# Patient Record
Sex: Female | Born: 2000 | Race: White | Hispanic: No | Marital: Single | Smoking: Never smoker
Health system: Southern US, Community
[De-identification: ages and names within clinical notes are randomized; demographics above are authoritative.]

---
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
## 2020-10-22 ENCOUNTER — Encounter (HOSPITAL_COMMUNITY): Payer: Self-pay

## 2020-10-22 ENCOUNTER — Other Ambulatory Visit: Payer: Self-pay

## 2020-10-22 ENCOUNTER — Ambulatory Visit (HOSPITAL_COMMUNITY)
Admission: EM | Admit: 2020-10-22 | Discharge: 2020-10-22 | Disposition: A | Discharge status: Home / Self Care | Payer: BLUE CROSS/BLUE SHIELD | Attending: Student | Admitting: Student

## 2020-10-22 DIAGNOSIS — H6121 Impacted cerumen, right ear: Secondary | ICD-10-CM | POA: Diagnosis not present

## 2020-10-22 NOTE — ED Triage Notes (Signed)
Pt in with c/o feeling like her right ear is clogged  Pt states she tried to gently clean it with a q tip today with no relief

## 2020-10-22 NOTE — ED Provider Notes (Signed)
[facility]    CSN: [id number] Arrival date & time: 10/22/20  1149      History   Chief Complaint Chief Complaint  Patient presents with  . ear clogged    HPI Wanda Delgado is a 20 y.o. female presenting with clogged right ear x8 hours.  States she has had issues with impacted cerumen in the past.  Tried to clean her ear out with a Q-tip this morning and that only made it worse.  Endorses muffled hearing R ear.  Denies pain, dizziness, tinnitus.  Denies URI symptoms including fever/chills, cough, congestion.  HPI  History reviewed. No pertinent past medical history.  There are no problems to display for this patient.   History reviewed. No pertinent surgical history.  OB History   No obstetric history on file.      Home Medications    Prior to Admission medications   Not on File    Family History History reviewed. No pertinent family history.  Social History Social History   Tobacco Use  . Smoking status: Never Smoker  . Smokeless tobacco: Never Used  Substance Use Topics  . Drug use: Never     Allergies   Patient has no known allergies.   Review of Systems Review of Systems  All other systems reviewed and are negative.    Physical Exam Triage Vital Signs ED Triage Vitals  Enc Vitals Group     BP      Pulse      Resp      Temp      Temp src      SpO2      Weight      Height      Head Circumference      Peak Flow      Pain Score      Pain Loc      Pain Edu?      Excl. in GC?    No data found.  Updated Vital Signs BP 127/78   Pulse 75   Temp 99 F (37.2 C) (Oral)   Resp 17   LMP 10/05/2020 (Exact Date)   SpO2 100%   Visual Acuity Right Eye Distance:   Left Eye Distance:   Bilateral Distance:    Right Eye Near:   Left Eye Near:    Bilateral Near:     Physical Exam Vitals reviewed.  Constitutional:      General: She is not in acute distress.    Appearance: Normal appearance. She is not ill-appearing  or diaphoretic.  HENT:     Head: Normocephalic and atraumatic.     Right Ear: Hearing, tympanic membrane, ear canal and external ear normal. No middle ear effusion. There is no impacted cerumen. No foreign body. Tympanic membrane is not perforated, erythematous, retracted or bulging.     Left Ear: Hearing, tympanic membrane, ear canal and external ear normal.  No middle ear effusion. There is no impacted cerumen. No foreign body. Tympanic membrane is not perforated, erythematous, retracted or bulging.     Ears:     Comments: R Tympanic membrane initially fully occluded by cerumen.  Following lavage, canal is erythematous but without bleeding or discharge.  Tympanic membrane is intact and appears healthy. Cardiovascular:     Rate and Rhythm: Normal rate and regular rhythm.     Heart sounds: Normal heart sounds.  Pulmonary:     Effort: Pulmonary effort is normal.     Breath sounds: Normal  breath sounds.  Skin:    General: Skin is warm.  Neurological:     General: No focal deficit present.     Mental Status: She is alert and oriented to person, place, and time.  Psychiatric:        Mood and Affect: Mood normal.        Behavior: Behavior normal.        Thought Content: Thought content normal.        Judgment: Judgment normal.      UC Treatments / Results  Labs (all labs ordered are listed, but only abnormal results are displayed) Labs Reviewed - No data to display  EKG   Radiology No results found.  Procedures Procedures (including critical care time)  Medications Ordered in UC Medications - No data to display  Initial Impression / Assessment and Plan / UC Course  I have reviewed the triage vital signs and the nursing notes.  Pertinent labs & imaging results that were available during my care of the patient were reviewed by me and considered in my medical decision making (see chart for details).     This patient is a 20 year old female presenting with right ear impacted  cerumen.  Following lavage, bilateral tympanic membranes are able to be visualized and appear healthy and intact.  Recommended Debrox drops for maintenance at home. Final Clinical Impressions(s) / UC Diagnoses   Final diagnoses:  Impacted cerumen of right ear   Discharge Instructions   None    ED Prescriptions    None     PDMP not reviewed this encounter.   Rhys Martini, PA-C 10/22/20 1336

## 2021-05-06 ENCOUNTER — Other Ambulatory Visit: Payer: Self-pay

## 2021-05-06 ENCOUNTER — Encounter: Payer: Self-pay | Admitting: Emergency Medicine

## 2021-05-06 ENCOUNTER — Ambulatory Visit
Admission: EM | Admit: 2021-05-06 | Discharge: 2021-05-06 | Disposition: A | Discharge status: Home / Self Care | Payer: Federal, State, Local not specified - PPO | Attending: Internal Medicine | Admitting: Internal Medicine

## 2021-05-06 DIAGNOSIS — R197 Diarrhea, unspecified: Secondary | ICD-10-CM | POA: Diagnosis not present

## 2021-05-06 DIAGNOSIS — R6889 Other general symptoms and signs: Secondary | ICD-10-CM | POA: Diagnosis not present

## 2021-05-06 DIAGNOSIS — Z20822 Contact with and (suspected) exposure to covid-19: Secondary | ICD-10-CM

## 2021-05-06 DIAGNOSIS — R112 Nausea with vomiting, unspecified: Secondary | ICD-10-CM | POA: Diagnosis not present

## 2021-05-06 DIAGNOSIS — A084 Viral intestinal infection, unspecified: Secondary | ICD-10-CM | POA: Diagnosis not present

## 2021-05-06 LAB — POCT INFLUENZA A/B
Influenza A, POC: NEGATIVE
Influenza B, POC: NEGATIVE

## 2021-05-06 MED ORDER — ONDANSETRON 4 MG PO TBDP: 4.0000 mg | ORAL_TABLET | Freq: Three times a day (TID) | ORAL | 0 refills | Status: DC | PRN

## 2021-05-06 NOTE — Discharge Instructions (Signed)
It appears that you have a viral illness that should resolve in the next few days. You have been prescribed a nausea medication to take as needed. Increase clear oral fluid intake as well. Go to the hospital if symptoms worsen.

## 2021-05-06 NOTE — ED Triage Notes (Signed)
Began having severe generalized abdominal cramps last night that caused her to have multiple episodes of nausea, vomiting, and diarrhea, to the point where she felt like she was going to pass out. Reports body aching and runny nose as well.

## 2021-05-06 NOTE — ED Provider Notes (Signed)
[facility]    CSN: [id number] Arrival date & time: 05/06/21  1335      History   Chief Complaint Chief Complaint  Patient presents with   Abdominal Pain    HPI Wanda Delgado is a 20 y.o. female.   Patient presents with nausea, vomiting, diarrhea, abdominal pain, body aches, fatigue that started last night.  Patient reports that her abdominal pain is generalized and is cramping in nature.  Denies any fevers or sick contacts.  Patient also has runny nose as well that started last night.  She has not yet taken any medications to help alleviate symptoms.  She has been able to keep fluids down.  Denies urinary burning, urinary frequency, irregular vaginal bleeding, any chance of pregnancy.   Abdominal Pain  History reviewed. No pertinent past medical history.  There are no problems to display for this patient.   History reviewed. No pertinent surgical history.  OB History   No obstetric history on file.      Home Medications    Prior to Admission medications   Medication Sig Start Date End Date Taking? Authorizing Provider  ondansetron (ZOFRAN-ODT) 4 MG disintegrating tablet Take 1 tablet (4 mg total) by mouth every 8 (eight) hours as needed for nausea or vomiting. 05/06/21  Yes Gustavus Bryant, FNP    Family History History reviewed. No pertinent family history.  Social History Social History   Tobacco Use   Smoking status: Never   Smokeless tobacco: Never  Substance Use Topics   Drug use: Never     Allergies   Patient has no known allergies.   Review of Systems Review of Systems Per HPI  Physical Exam Triage Vital Signs ED Triage Vitals  Enc Vitals Group     BP 05/06/21 1439 127/78     Pulse Rate 05/06/21 1439 61     Resp 05/06/21 1439 16     Temp 05/06/21 1439 98.2 F (36.8 C)     Temp Source 05/06/21 1439 Oral     SpO2 05/06/21 1439 98 %     Weight --      Height --      Head Circumference --      Peak Flow --      Pain  Score 05/06/21 1440 6     Pain Loc --      Pain Edu? --      Excl. in GC? --    No data found.  Updated Vital Signs BP 127/78 (BP Location: Right Arm)   Pulse 61   Temp 98.2 F (36.8 C) (Oral)   Resp 16   SpO2 98%   Visual Acuity Right Eye Distance:   Left Eye Distance:   Bilateral Distance:    Right Eye Near:   Left Eye Near:    Bilateral Near:     Physical Exam Constitutional:      General: She is not in acute distress.    Appearance: Normal appearance. She is not toxic-appearing or diaphoretic.  HENT:     Head: Normocephalic and atraumatic.     Right Ear: Tympanic membrane and ear canal normal.     Left Ear: Tympanic membrane and ear canal normal.     Nose: Congestion present.     Mouth/Throat:     Mouth: Mucous membranes are moist.     Pharynx: No posterior oropharyngeal erythema.  Eyes:     Extraocular Movements: Extraocular movements intact.     Conjunctiva/sclera: Conjunctivae  normal.     Pupils: Pupils are equal, round, and reactive to light.  Cardiovascular:     Rate and Rhythm: Normal rate and regular rhythm.     Pulses: Normal pulses.     Heart sounds: Normal heart sounds.  Pulmonary:     Effort: Pulmonary effort is normal. No respiratory distress.     Breath sounds: Normal breath sounds. No stridor. No wheezing, rhonchi or rales.  Abdominal:     General: Abdomen is flat. Bowel sounds are normal.     Palpations: Abdomen is soft.     Tenderness: There is no abdominal tenderness. There is no right CVA tenderness, left CVA tenderness, guarding or rebound. Negative signs include Murphy's sign, Rovsing's sign, McBurney's sign, psoas sign and obturator sign.  Musculoskeletal:        General: Normal range of motion.     Cervical back: Normal range of motion.  Skin:    General: Skin is warm and dry.  Neurological:     General: No focal deficit present.     Mental Status: She is alert and oriented to person, place, and time. Mental status is at baseline.   Psychiatric:        Mood and Affect: Mood normal.        Behavior: Behavior normal.     UC Treatments / Results  Labs (all labs ordered are listed, but only abnormal results are displayed) Labs Reviewed  NOVEL CORONAVIRUS, NAA  POCT INFLUENZA A/B    EKG   Radiology No results found.  Procedures Procedures (including critical care time)  Medications Ordered in UC Medications - No data to display  Initial Impression / Assessment and Plan / UC Course  I have reviewed the triage vital signs and the nursing notes.  Pertinent labs & imaging results that were available during my care of the patient were reviewed by me and considered in my medical decision making (see chart for details).     Patient's symptoms appear viral in etiology.  No suspicion for urinary tract infection or any gynecological problem.  Rapid flu was negative.  COVID-19 PCR pending.  Ondansetron prescribed as needed for nausea.  Discussed strict return precautions.  Patient to increase clear oral fluid intake.  Advised patient to go to the hospital if symptoms worsen or do not improve.  Patient verbalized understanding and was agreeable with plan. Final Clinical Impressions(s) / UC Diagnoses   Final diagnoses:  Viral gastroenteritis  Nausea vomiting and diarrhea  Flu-like symptoms  Encounter for laboratory testing for COVID-19 virus     Discharge Instructions      It appears that you have a viral illness that should resolve in the next few days. You have been prescribed a nausea medication to take as needed. Increase clear oral fluid intake as well. Go to the hospital if symptoms worsen.     ED Prescriptions     Medication Sig Dispense Auth. Provider   ondansetron (ZOFRAN-ODT) 4 MG disintegrating tablet Take 1 tablet (4 mg total) by mouth every 8 (eight) hours as needed for nausea or vomiting. 20 tablet [place], Acie Fredrickson, [place]      PDMP not reviewed this encounter.   Gustavus Bryant, [place] 05/06/21  [phone]

## 2021-05-07 LAB — NOVEL CORONAVIRUS, NAA: SARS-CoV-2, NAA: NOT DETECTED

## 2021-05-13 ENCOUNTER — Ambulatory Visit
Admission: EM | Admit: 2021-05-13 | Discharge: 2021-05-13 | Disposition: A | Discharge status: Home / Self Care | Payer: Federal, State, Local not specified - PPO

## 2021-05-13 ENCOUNTER — Other Ambulatory Visit: Payer: Self-pay

## 2021-05-13 DIAGNOSIS — J069 Acute upper respiratory infection, unspecified: Secondary | ICD-10-CM

## 2021-05-13 NOTE — ED Triage Notes (Signed)
Last seen on 05/06/21 for abdominal pain, body aches and runny nose.  Pt reports 4 days ago she began to have eye drainage, sore throat, cough, ear clogged sensation, fatigue and chills. Runny nose and cough have worsened since the last OV.  Has been taking mucinex and dayquil with some relief.

## 2021-05-13 NOTE — ED Provider Notes (Signed)
[facility]    CSN: [id number] Arrival date & time: 05/13/21  1207      History   Chief Complaint Chief Complaint  Patient presents with   Cough   Nasal Congestion    HPI Wanda Delgado is a 20 y.o. female.   FatiguePatient here today for evaluation of sore throat, cough, and chills that started 4 days ago.  She reports that she was seen about a week ago for abdominal pain body aches and runny nose and at that time had negative COVID and flu screening.  She states that her current symptoms are started after that visit.  She has been trying Mucinex and DayQuil without significant relief.  The history is provided by the patient.  Cough Associated symptoms: chills and sore throat   Associated symptoms: no eye discharge, no fever and no shortness of breath    History reviewed. No pertinent past medical history.  There are no problems to display for this patient.   History reviewed. No pertinent surgical history.  OB History   No obstetric history on file.      Home Medications    Prior to Admission medications   Medication Sig Start Date End Date Taking? Authorizing Provider  etonogestrel (NEXPLANON) 68 MG IMPL implant Nexplanon 68 mg subdermal implant  1 device provided by [facility].    [provider]  ondansetron (ZOFRAN-ODT) 4 MG disintegrating tablet Take 1 tablet (4 mg total) by mouth every 8 (eight) hours as needed for nausea or vomiting. 05/06/21   Gustavus Bryant, FNP    Family History History reviewed. No pertinent family history.  Social History Social History   Tobacco Use   Smoking status: Never   Smokeless tobacco: Never  Substance Use Topics   Drug use: Never     Allergies   Patient has no known allergies.   Review of Systems Review of Systems  Constitutional:  Positive for chills. Negative for fever.  HENT:  Positive for congestion and sore throat.   Eyes:  Negative for discharge and redness.  Respiratory:   Positive for cough. Negative for shortness of breath.     Physical Exam Triage Vital Signs ED Triage Vitals  Enc Vitals Group     BP 05/13/21 1307 126/83     Pulse Rate 05/13/21 1307 73     Resp 05/13/21 1307 18     Temp 05/13/21 1307 98.4 F (36.9 C)     Temp Source 05/13/21 1307 Oral     SpO2 05/13/21 1307 96 %     Weight --      Height --      Head Circumference --      Peak Flow --      Pain Score 05/13/21 1310 7     Pain Loc --      Pain Edu? --      Excl. in GC? --    No data found.  Updated Vital Signs BP 126/83 (BP Location: Left Arm)    Pulse 73    Temp 98.4 F (36.9 C) (Oral)    Resp 18    SpO2 96%      Physical Exam Vitals and nursing note reviewed.  Constitutional:      General: She is not in acute distress.    Appearance: Normal appearance. She is not ill-appearing.  HENT:     Head: Normocephalic and atraumatic.     Nose: Congestion present.     Mouth/Throat:  Mouth: Mucous membranes are moist.     Pharynx: No oropharyngeal exudate or posterior oropharyngeal erythema.  Eyes:     Conjunctiva/sclera: Conjunctivae normal.  Cardiovascular:     Rate and Rhythm: Normal rate and regular rhythm.     Heart sounds: Normal heart sounds. No murmur heard. Pulmonary:     Effort: Pulmonary effort is normal. No respiratory distress.     Breath sounds: Normal breath sounds. No wheezing, rhonchi or rales.  Skin:    General: Skin is warm and dry.  Neurological:     Mental Status: She is alert.  Psychiatric:        Mood and Affect: Mood normal.        Thought Content: Thought content normal.     UC Treatments / Results  Labs (all labs ordered are listed, but only abnormal results are displayed) Labs Reviewed  COVID-19, FLU A+B NAA    EKG   Radiology No results found.  Procedures Procedures (including critical care time)  Medications Ordered in UC Medications - No data to display  Initial Impression / Assessment and Plan / UC Course  I have  reviewed the triage vital signs and the nursing notes.  Pertinent labs & imaging results that were available during my care of the patient were reviewed by me and considered in my medical decision making (see chart for details).    Will repeat COVID and flu screening.  Suspect likely viral etiology of symptoms.  Recommend follow-up with any further concerns while awaiting results.  Encouraged continued symptomatic treatment, increase fluids and rest in the meantime.  Final Clinical Impressions(s) / UC Diagnoses   Final diagnoses:  Acute upper respiratory infection   Discharge Instructions   None    ED Prescriptions   None    PDMP not reviewed this encounter.   Tomi Bamberger, PA-C 05/13/21 [phone]

## 2021-05-14 LAB — COVID-19, FLU A+B NAA
Influenza A, NAA: NOT DETECTED
Influenza B, NAA: NOT DETECTED
SARS-CoV-2, NAA: NOT DETECTED

## 2021-05-15 ENCOUNTER — Ambulatory Visit
Admission: EM | Admit: 2021-05-15 | Discharge: 2021-05-15 | Disposition: A | Discharge status: Home / Self Care | Payer: Federal, State, Local not specified - PPO | Source: Home / Self Care

## 2021-05-15 ENCOUNTER — Emergency Department (HOSPITAL_BASED_OUTPATIENT_CLINIC_OR_DEPARTMENT_OTHER)
Admission: EM | Admit: 2021-05-15 | Discharge: 2021-05-16 | Disposition: A | Discharge status: Home / Self Care | Payer: Federal, State, Local not specified - PPO | Attending: Emergency Medicine | Admitting: Emergency Medicine

## 2021-05-15 ENCOUNTER — Other Ambulatory Visit: Payer: Self-pay

## 2021-05-15 ENCOUNTER — Encounter (HOSPITAL_BASED_OUTPATIENT_CLINIC_OR_DEPARTMENT_OTHER): Payer: Self-pay

## 2021-05-15 DIAGNOSIS — R590 Localized enlarged lymph nodes: Secondary | ICD-10-CM | POA: Insufficient documentation

## 2021-05-15 DIAGNOSIS — E041 Nontoxic single thyroid nodule: Secondary | ICD-10-CM | POA: Diagnosis not present

## 2021-05-15 DIAGNOSIS — J039 Acute tonsillitis, unspecified: Secondary | ICD-10-CM

## 2021-05-15 DIAGNOSIS — J029 Acute pharyngitis, unspecified: Secondary | ICD-10-CM

## 2021-05-15 DIAGNOSIS — J353 Hypertrophy of tonsils with hypertrophy of adenoids: Secondary | ICD-10-CM | POA: Diagnosis not present

## 2021-05-15 LAB — BASIC METABOLIC PANEL
Anion gap: 8 (ref 5–15)
BUN: 13 mg/dL (ref 6–20)
CO2: 25 mmol/L (ref 22–32)
Calcium: 9.6 mg/dL (ref 8.9–10.3)
Chloride: 101 mmol/L (ref 98–111)
Creatinine, Ser: 0.77 mg/dL (ref 0.44–1.00)
GFR, Estimated: >60 mL/min (ref >60)
Glucose, Bld: 103 mg/dL — ABNORMAL HIGH (ref 70–99)
Potassium: 4.7 mmol/L (ref 3.5–5.1)
Sodium: 134 mmol/L — ABNORMAL LOW (ref 135–145)

## 2021-05-15 LAB — CBC WITH DIFFERENTIAL/PLATELET
Abs Immature Granulocytes: 0.03 10*3/uL (ref 0.00–0.07)
Basophils Absolute: 0.1 10*3/uL (ref 0.0–0.1)
Basophils Relative: 0 %
Eosinophils Absolute: 0 10*3/uL (ref 0.0–0.5)
Eosinophils Relative: 0 %
HCT: 44.2 % (ref 36.0–46.0)
Hemoglobin: 14.7 g/dL (ref 12.0–15.0)
Immature Granulocytes: 0 %
Lymphocytes Relative: 9 %
Lymphs Abs: 1.1 10*3/uL (ref 0.7–4.0)
MCH: 30.4 pg (ref 26.0–34.0)
MCHC: 33.3 g/dL (ref 30.0–36.0)
MCV: 91.5 fL (ref 80.0–100.0)
Monocytes Absolute: 0.3 10*3/uL (ref 0.1–1.0)
Monocytes Relative: 3 %
Neutro Abs: 10.8 10*3/uL — ABNORMAL HIGH (ref 1.7–7.7)
Neutrophils Relative %: 88 %
Platelets: 226 10*3/uL (ref 150–400)
RBC: 4.83 MIL/uL (ref 3.87–5.11)
RDW: 12.6 % (ref 11.5–15.5)
WBC: 12.2 10*3/uL — ABNORMAL HIGH (ref 4.0–10.5)
nRBC: 0 % (ref 0.0–0.2)

## 2021-05-15 LAB — PREGNANCY, URINE: Preg Test, Ur: NEGATIVE

## 2021-05-15 LAB — POCT RAPID STREP A (OFFICE): Rapid Strep A Screen: NEGATIVE

## 2021-05-15 MED ORDER — KETOROLAC TROMETHAMINE 60 MG/2ML IM SOLN
60.0000 mg | Freq: Once | INTRAMUSCULAR | Status: AC
  Administered 2021-05-15: 20:00:00 60 mg via INTRAMUSCULAR
  Filled 2021-05-15: qty 2

## 2021-05-15 MED ORDER — DEXAMETHASONE SODIUM PHOSPHATE 4 MG/ML IJ SOLN
4.0000 mg | Freq: Once | INTRAMUSCULAR | Status: AC
  Administered 2021-05-15: 20:00:00 4 mg via INTRAMUSCULAR
  Filled 2021-05-15: qty 1

## 2021-05-15 NOTE — ED Provider Notes (Signed)
[facility]    CSN: [id number] Arrival date & time: 05/15/21  1825      History   Chief Complaint Chief Complaint  Patient presents with   Sore Throat    HPI Wanda Delgado is a 20 y.o. female.   Patient presents today for with persistent sore throat.  She was seen on 05/06/2021 with gastrointestinal symptoms that have now resolved.  She was then seen on 05/13/2021 with upper respiratory symptoms.  COVID-19 and flu tests have been completed that were all negative.  Patient reports all symptoms have resolved except for persistent sore throat.  She also has some right-sided neck pain and swelling.  T-max at home was 99.  She does have some difficulty swallowing but is able to swallow her sputum.   Sore Throat   History reviewed. No pertinent past medical history.  There are no problems to display for this patient.   History reviewed. No pertinent surgical history.  OB History   No obstetric history on file.      Home Medications    Prior to Admission medications   Medication Sig Start Date End Date Taking? Authorizing Provider  etonogestrel (NEXPLANON) 68 MG IMPL implant Nexplanon 68 mg subdermal implant  1 device provided [facility].    [provider]  ondansetron (ZOFRAN-ODT) 4 MG disintegrating tablet Take 1 tablet (4 mg total) by mouth every 8 (eight) hours as needed for nausea or vomiting. 05/06/21   Gustavus Bryant, FNP    Family History History reviewed. No pertinent family history.  Social History Social History   Tobacco Use   Smoking status: Never   Smokeless tobacco: Never  Substance Use Topics   Drug use: Never     Allergies   Patient has no known allergies.   Review of Systems Review of Systems Per HPI  Physical Exam Triage Vital Signs ED Triage Vitals [05/15/21 1832]  Enc Vitals Group     BP (!) 131/97     Pulse Rate (!) 126     Resp 18     Temp 98.4 F (36.9 C)     Temp Source Oral     SpO2 98 %      Weight      Height      Head Circumference      Peak Flow      Pain Score 0     Pain Loc      Pain Edu?      Excl. in GC?    No data found.  Updated Vital Signs BP (!) 131/97 (BP Location: Left Arm)    Pulse (!) 126    Temp 98.4 F (36.9 C) (Oral)    Resp 18    SpO2 98%   Visual Acuity Right Eye Distance:   Left Eye Distance:   Bilateral Distance:    Right Eye Near:   Left Eye Near:    Bilateral Near:     Physical Exam Constitutional:      General: She is not in acute distress.    Appearance: Normal appearance. She is not toxic-appearing or diaphoretic.  HENT:     Head: Normocephalic and atraumatic.     Mouth/Throat:     Lips: Pink.     Mouth: Mucous membranes are moist.     Pharynx: Oropharyngeal exudate and posterior oropharyngeal erythema present. No pharyngeal swelling or uvula swelling.     Tonsils: Tonsillar exudate present. 2+ on the right. 1+ on  the left.  Eyes:     Extraocular Movements: Extraocular movements intact.     Conjunctiva/sclera: Conjunctivae normal.  Pulmonary:     Effort: Pulmonary effort is normal.  Lymphadenopathy:     Cervical: Cervical adenopathy present.     Right cervical: Superficial cervical adenopathy present.  Neurological:     General: No focal deficit present.     Mental Status: She is alert and oriented to person, place, and time. Mental status is at baseline.  Psychiatric:        Mood and Affect: Mood normal.        Behavior: Behavior normal.        Thought Content: Thought content normal.        Judgment: Judgment normal.     UC Treatments / Results  Labs (all labs ordered are listed, but only abnormal results are displayed) Labs Reviewed  CULTURE, GROUP A STREP [facility])    EKG   Radiology No results found.  Procedures Procedures (including critical care time)  Medications Ordered in UC Medications - No data to display  Initial Impression / Assessment and Plan / UC Course  I have reviewed the triage  vital signs and the nursing notes.  Pertinent labs & imaging results that were available during my care of the patient were reviewed by me and considered in my medical decision making (see chart for details).     Rapid strep was negative in urgent care today.  There is concern for peritonsillar abscess or peritonsillar cellulitis given appearance of posterior pharynx and tonsils on exam as well as rapid heart rate.  Do think that patient will need to go to the hospital to rule this out with CT imaging.  Patient advised to go to the hospital for further evaluation and management.  Patient was agreeable with plan.  Vital signs fairly stable at discharge.  Agree with patient self transport to the hospital. Final Clinical Impressions(s) / UC Diagnoses   Final diagnoses:  Acute tonsillitis, unspecified etiology  Sore throat  Cervical lymphadenopathy     Discharge Instructions      Please go to the emergency department as soon as you leave urgent care to rule out peritonsillar abscess or peritonsillar cellulitis.  Your rapid strep was negative.  Throat culture is pending.     ED Prescriptions   None    PDMP not reviewed this encounter.   Gustavus Bryant, [place] 05/15/21 [phone]

## 2021-05-15 NOTE — ED Triage Notes (Signed)
Pt c/o sore throat, states 3rd time coming to this clinic for this sx. Right neck edema.

## 2021-05-15 NOTE — ED Notes (Signed)
PA IN TRIAGE FOR ASSESSMENT

## 2021-05-15 NOTE — ED Provider Notes (Signed)
Emergency Medicine Provider Triage Evaluation Note  Wanda Delgado , a 20 y.o. female  was evaluated in triage.  I was asked to medically screened this patient due to being sent from urgent care with concern for possible peritonsillar abscess.  On arrival to the ED today vitals are stable.  Patient appears to be no acute distress.  No hot potato voice.  She does have 2+ tonsillar hypertrophy on the right with exudate and 1+ tonsillar hypertrophy on the left.  Her uvula is midline.  She is tolerating her secretions without difficulty.  She did have COVID and flu testing on 12/19 and then strep testing done today.  She has not really taken anything for pain.  Denies fevers or chills.  Review of Systems  Positive: + sore throat Negative: - trismus, difficulty swallowing, voice change, drooling  Physical Exam  BP (!) 148/86 (BP Location: Left Arm)    Pulse 84    Temp 98.5 F (36.9 C)    Resp 18    Ht 5\' 8"  (1.727 m)    Wt 72.6 kg    SpO2 100%    BMI 24.33 kg/m  Gen:   Awake, no distress   Resp:  Normal effort  MSK:   Moves extremities without difficulty  Other:  Uvula is midline. + tonsillar hypertrophy bilaterally however R > L. Exudate on right tonsil. Phonating normally.   Medical Decision Making  Medically screening exam initiated at 7:41 PM.  Appropriate orders placed.  was informed that the remainder of the evaluation will be completed by another provider, this initial triage assessment does not replace that evaluation, and the importance of remaining in the ED until their evaluation is complete.     Durene Fruits, PA-C 05/15/21 1943    Tegeler, 05/17/21, MD 05/15/21 [phone]

## 2021-05-15 NOTE — Discharge Instructions (Addendum)
Please go to the emergency department as soon as you leave urgent care to rule out peritonsillar abscess or peritonsillar cellulitis.  Your rapid strep was negative.  Throat culture is pending.

## 2021-05-15 NOTE — ED Notes (Signed)
PA ORDERED UPREG PRIOR TO MEDS-PT NOTIFIED

## 2021-05-15 NOTE — ED Triage Notes (Addendum)
Pt c/o sore throat started 12/19-was seen UC 12/19 and today-no throat testing and neg covid test 12/19-had neg strep today-sent to ED for possible abscess-NAD-steady gait

## 2021-05-16 ENCOUNTER — Other Ambulatory Visit (HOSPITAL_BASED_OUTPATIENT_CLINIC_OR_DEPARTMENT_OTHER): Payer: Federal, State, Local not specified - PPO

## 2021-05-16 ENCOUNTER — Emergency Department (HOSPITAL_BASED_OUTPATIENT_CLINIC_OR_DEPARTMENT_OTHER): Payer: Federal, State, Local not specified - PPO

## 2021-05-16 DIAGNOSIS — E041 Nontoxic single thyroid nodule: Secondary | ICD-10-CM | POA: Diagnosis not present

## 2021-05-16 DIAGNOSIS — R590 Localized enlarged lymph nodes: Secondary | ICD-10-CM | POA: Diagnosis not present

## 2021-05-16 DIAGNOSIS — J353 Hypertrophy of tonsils with hypertrophy of adenoids: Secondary | ICD-10-CM | POA: Diagnosis not present

## 2021-05-16 DIAGNOSIS — J029 Acute pharyngitis, unspecified: Secondary | ICD-10-CM | POA: Diagnosis not present

## 2021-05-16 MED ORDER — CEPHALEXIN 500 MG PO CAPS: 500.0000 mg | ORAL_CAPSULE | Freq: Four times a day (QID) | ORAL | 0 refills | Status: DC

## 2021-05-16 MED ORDER — IOHEXOL 300 MG/ML  SOLN
75.0000 mL | Freq: Once | INTRAMUSCULAR | Status: AC | PRN
  Administered 2021-05-16: 75 mL via INTRAVENOUS

## 2021-05-16 MED ORDER — PREDNISONE 20 MG PO TABS: ORAL_TABLET | ORAL | 0 refills | Status: DC

## 2021-05-16 NOTE — Discharge Instructions (Addendum)
Please refer to the attached instructions 

## 2021-05-16 NOTE — ED Provider Notes (Signed)
[facility] EMERGENCY DEPARTMENT Provider Note   CSN: [id number] Arrival date & time: 05/15/21  1911     History Chief Complaint  Patient presents with   Sore Throat    Wanda Delgado is a 20 y.o. female.  Patient presents for evaluation of sore throat. She was seen at urgent care earlier today and sent to the ED for evaluation for possible peritonsillar abscess or cellulitis. Patient received MSE by M. Hyman Hopes, PA-C. She was negative for strep at the urgent care today. Negative covid and flu testing on 12/19. Patient was given toradol and decadron while awaiting additional evaluation with improvement in discomfort.  The history is provided by the patient and medical records.  Sore Throat This is a new problem. The current episode started 2 days ago. The problem has not changed since onset.     History reviewed. No pertinent past medical history.  There are no problems to display for this patient.   History reviewed. No pertinent surgical history.   OB History   No obstetric history on file.     No family history on file.  Social History   Tobacco Use   Smoking status: Never   Smokeless tobacco: Never  Vaping Use   Vaping Use: Never used  Substance Use Topics   Alcohol use: Yes    Comment: occ   Drug use: Never    Home Medications Prior to Admission medications   Medication Sig Start Date End Date Taking? Authorizing Provider  etonogestrel (NEXPLANON) 68 MG IMPL implant Nexplanon 68 mg subdermal implant  1 device provided by [facility].    [provider]  ondansetron (ZOFRAN-ODT) 4 MG disintegrating tablet Take 1 tablet (4 mg total) by mouth every 8 (eight) hours as needed for nausea or vomiting. 05/06/21   Gustavus Bryant, FNP    Allergies    Patient has no known allergies.  Review of Systems   Review of Systems  HENT:  Positive for sore throat.   All other systems reviewed and are negative.  Physical Exam Updated Vital  Signs BP 117/67    Pulse (!) 59    Temp 98.5 F (36.9 C)    Resp 18    Ht 5\' 8"  (1.727 m)    Wt 72.6 kg    SpO2 98%    BMI 24.33 kg/m   Physical Exam Constitutional:      Appearance: She is well-developed.  HENT:     Head: Normocephalic.     Mouth/Throat:     Pharynx: Pharyngeal swelling, oropharyngeal exudate and posterior oropharyngeal erythema present.  Eyes:     Pupils: Pupils are equal, round, and reactive to light.  Cardiovascular:     Rate and Rhythm: Normal rate and regular rhythm.  Pulmonary:     Effort: Pulmonary effort is normal.     Breath sounds: Normal breath sounds.  Abdominal:     Palpations: Abdomen is soft.  Musculoskeletal:        General: Normal range of motion.     Cervical back: Normal range of motion.  Lymphadenopathy:     Cervical: Cervical adenopathy present.  Skin:    General: Skin is warm and dry.  Neurological:     Mental Status: She is alert and oriented to person, place, and time.  Psychiatric:        Mood and Affect: Mood normal.        Behavior: Behavior normal.    ED Results / Procedures /  Treatments   Labs (all labs ordered are listed, but only abnormal results are displayed) Labs Reviewed  CBC WITH DIFFERENTIAL/PLATELET - Abnormal; Notable for the following components:      Result Value   WBC 12.2 (*)    Neutro Abs 10.8 (*)    All other components within normal limits  BASIC METABOLIC PANEL - Abnormal; Notable for the following components:   Sodium 134 (*)    Glucose, Bld 103 (*)    All other components within normal limits  PREGNANCY, URINE    EKG None  Radiology CT Soft Tissue Neck W Contrast  Result Date: 05/16/2021 CLINICAL DATA:  Initial evaluation for acute sore throat. EXAM: CT NECK WITH CONTRAST TECHNIQUE: Multidetector CT imaging of the neck was performed using the standard protocol following the bolus administration of intravenous contrast. CONTRAST:  74mL OMNIPAQUE IOHEXOL 300 MG/ML  SOLN COMPARISON:  None.  FINDINGS: Pharynx and larynx: Oral cavity within normal limits. Palatine tonsils are prominent and hypertrophied bilaterally, right slightly worse than left, suggesting acute tonsillitis. No discrete tonsillar or peritonsillar abscess. Adenoidal soft tissues are prominent as well. No retropharyngeal collection or swelling. Epiglottis within normal limits. Remainder of the hypopharynx and supraglottic larynx within normal limits. Glottis normal. Subglottic airway patent clear. Salivary glands: Salivary glands including the parotid and submandibular glands are within normal limits. Thyroid: Tiny 2 mm nodule present within the right thyroid lobe (series 3, image 100), of doubtful significance given size and patient age, no follow-up imaging recommended (ref: J Am Coll Radiol. 2015 Feb;12(2): 143-50). Lymph nodes: Enlarged 1.9 cm right level II lymph node (series 3, image 64), presumably reactive. No other enlarged or pathologic adenopathy within the neck. Vascular: Normal intravascular enhancement seen throughout the neck. Limited intracranial: Unremarkable. Visualized orbits: Unremarkable. Mastoids and visualized paranasal sinuses: Paranasal sinuses are clear. Right-to-left nasal septal deviation with associated concha bullosa. Mastoid air cells and middle ear cavities are well pneumatized and free of fluid. Skeleton: No acute osseous finding. No discrete or worrisome osseous lesions. Upper chest: Visualized upper chest demonstrates no acute finding. Partially visualized lungs are clear. Other: None. IMPRESSION: 1. Prominent and hypertrophied palatine tonsils and adenoidal soft tissues, right worse than left, suggesting acute tonsillitis. No discrete tonsillar or peritonsillar abscess. 2. Enlarged 1.9 cm right level II lymph node, presumably reactive. Electronically Signed   By: Rise Mu M.D.   On: 05/16/2021 00:55    Procedures Procedures   Medications Ordered in ED Medications  ketorolac (TORADOL)  injection 60 mg (60 mg Intramuscular Given 05/15/21 2027)  dexamethasone (DECADRON) injection 4 mg (4 mg Intramuscular Given 05/15/21 2028)  iohexol (OMNIPAQUE) 300 MG/ML solution 75 mL (75 mLs Intravenous Contrast Given 05/16/21 0015)    ED Course  I have reviewed the triage vital signs and the nursing notes.  Pertinent labs & imaging results that were available during my care of the patient were reviewed by me and considered in my medical decision making (see chart for details).    MDM Rules/Calculators/A&P                         Pt with negative strep. CT evaluation does not reveal discrete peritonsillar abscess. Presentation and findings consistent with acute tonsillitis.Discharge with keflex and prednisone. No evidence of dehydration. Pt is tolerating secretions. Specific return precautions discussed. Recommended PCP follow up. Pt appears safe for discharge.     Final Clinical Impression(s) / ED Diagnoses Final diagnoses:  Tonsillitis  Rx / DC Orders ED Discharge Orders          Ordered    cephALEXin (KEFLEX) 500 MG capsule  4 times daily        05/16/21 0109    predniSONE (DELTASONE) 20 MG tablet        05/16/21 0109             Felicie Morn, NP 05/16/21 0110    Tegeler, Canary Brim, MD 05/16/21 1043

## 2021-05-17 ENCOUNTER — Encounter (HOSPITAL_BASED_OUTPATIENT_CLINIC_OR_DEPARTMENT_OTHER): Payer: Self-pay | Admitting: *Deleted

## 2021-05-17 ENCOUNTER — Other Ambulatory Visit: Payer: Self-pay

## 2021-05-17 ENCOUNTER — Emergency Department (HOSPITAL_BASED_OUTPATIENT_CLINIC_OR_DEPARTMENT_OTHER)
Admission: EM | Admit: 2021-05-17 | Discharge: 2021-05-17 | Disposition: A | Discharge status: Home / Self Care | Payer: Federal, State, Local not specified - PPO | Attending: Emergency Medicine | Admitting: Emergency Medicine

## 2021-05-17 DIAGNOSIS — J029 Acute pharyngitis, unspecified: Secondary | ICD-10-CM

## 2021-05-17 DIAGNOSIS — R070 Pain in throat: Secondary | ICD-10-CM | POA: Diagnosis not present

## 2021-05-17 DIAGNOSIS — Z20822 Contact with and (suspected) exposure to covid-19: Secondary | ICD-10-CM | POA: Insufficient documentation

## 2021-05-17 DIAGNOSIS — R059 Cough, unspecified: Secondary | ICD-10-CM | POA: Diagnosis not present

## 2021-05-17 LAB — RESP PANEL BY RT-PCR (FLU A&B, COVID) ARPGX2
Influenza A by PCR: NEGATIVE
Influenza B by PCR: NEGATIVE
SARS Coronavirus 2 by RT PCR: NEGATIVE

## 2021-05-17 LAB — GROUP A STREP BY PCR: Group A Strep by PCR: NOT DETECTED

## 2021-05-17 LAB — MONONUCLEOSIS SCREEN: Mono Screen: NEGATIVE

## 2021-05-17 MED ORDER — KETOROLAC TROMETHAMINE 60 MG/2ML IM SOLN
60.0000 mg | Freq: Once | INTRAMUSCULAR | Status: AC
  Administered 2021-05-17: 17:00:00 60 mg via INTRAMUSCULAR
  Filled 2021-05-17: qty 2

## 2021-05-17 NOTE — ED Triage Notes (Signed)
Recheck sore throat. She was treated for tonsillitis 4 days ago.

## 2021-05-17 NOTE — ED Provider Notes (Signed)
[facility] EMERGENCY DEPARTMENT Provider Note   CSN: [id number] Arrival date & time: 05/17/21  1407     History Chief Complaint  Patient presents with   Follow-up    Wanda Delgado is a 20 y.o. female presenting today with a complaint of a sore throat.  Patient was first seen for this on 12/19 when she also had, body aches, chills and runny nose.  COVID and flu testing were negative at that time.  She then presented to her primary care provider on 12/21 who sent her to the emergency department to rule out a PTA.  This was ruled out.  She was diagnosed with tonsillitis and given Keflex and prednisone for her symptoms.  She reports that today she is returning due to increased throat discomfort.  Last night she felt as though her throat could close up and she was afraid to sleep.  Occasional nonproductive cough.  No fevers or chills.  History reviewed. No pertinent past medical history.  There are no problems to display for this patient.   History reviewed. No pertinent surgical history.   OB History   No obstetric history on file.     No family history on file.  Social History   Tobacco Use   Smoking status: Never   Smokeless tobacco: Never  Vaping Use   Vaping Use: Never used  Substance Use Topics   Alcohol use: Yes    Comment: occ   Drug use: Never    Home Medications Prior to Admission medications   Medication Sig Start Date End Date Taking? Authorizing Provider  cephALEXin (KEFLEX) 500 MG capsule Take 1 capsule (500 mg total) by mouth 4 (four) times daily. 05/16/21   Felicie Morn, NP  etonogestrel (NEXPLANON) 68 MG IMPL implant Nexplanon 68 mg subdermal implant  1 device provided by [facility].    [provider]  ondansetron (ZOFRAN-ODT) 4 MG disintegrating tablet Take 1 tablet (4 mg total) by mouth every 8 (eight) hours as needed for nausea or vomiting. 05/06/21   Gustavus Bryant, FNP  predniSONE (DELTASONE) 20 MG tablet 3 tabs po day one,  then 2 tabs daily x 4 days 05/16/21   Felicie Morn, NP    Allergies    Patient has no known allergies.  Review of Systems   Review of Systems  Constitutional:  Negative for chills and fever.  HENT:  Positive for sore throat.   Respiratory:  Positive for cough and shortness of breath.    Physical Exam Updated Vital Signs BP 126/90 (BP Location: Right Arm)    Pulse 76    Temp 98.1 F (36.7 C) (Oral)    Resp 18    Ht 5\' 8"  (1.727 m)    Wt 72.6 kg    SpO2 99%    BMI 24.34 kg/m   Physical Exam Vitals and nursing note reviewed.  Constitutional:      General: She is not in acute distress.    Appearance: Normal appearance. She is not ill-appearing.  HENT:     Head: Normocephalic and atraumatic.     Mouth/Throat:     Mouth: Mucous membranes are moist.     Pharynx: Oropharynx is clear.     Comments: 2+ tonsillar adenopathy bilaterally.  Erythema and exudates present on both sides, worse on her right side.  No sign of abscess, tolerating secretions and airway patent Eyes:     General: No scleral icterus.    Conjunctiva/sclera: Conjunctivae normal.  Pulmonary:  Effort: Pulmonary effort is normal. No respiratory distress.  Musculoskeletal:     Cervical back: Normal range of motion.  Skin:    General: Skin is warm and dry.     Findings: No rash.  Neurological:     Mental Status: She is alert.  Psychiatric:        Mood and Affect: Mood normal.    ED Results / Procedures / Treatments   Labs (all labs ordered are listed, but only abnormal results are displayed) Labs Reviewed  GROUP A STREP BY PCR  RESP PANEL BY RT-PCR (FLU A&B, COVID) ARPGX2  MONONUCLEOSIS SCREEN  EPSTEIN-BARR VIRUS VCA, IGG  EPSTEIN-BARR VIRUS VCA, IGM  RSV(RESPIRATORY SYNCYTIAL VIRUS) AB, BLOOD    EKG None  Radiology CT Soft Tissue Neck W Contrast  Result Date: 05/16/2021 CLINICAL DATA:  Initial evaluation for acute sore throat. EXAM: CT NECK WITH CONTRAST TECHNIQUE: Multidetector CT imaging of the  neck was performed using the standard protocol following the bolus administration of intravenous contrast. CONTRAST:  61mL OMNIPAQUE IOHEXOL 300 MG/ML  SOLN COMPARISON:  None. FINDINGS: Pharynx and larynx: Oral cavity within normal limits. Palatine tonsils are prominent and hypertrophied bilaterally, right slightly worse than left, suggesting acute tonsillitis. No discrete tonsillar or peritonsillar abscess. Adenoidal soft tissues are prominent as well. No retropharyngeal collection or swelling. Epiglottis within normal limits. Remainder of the hypopharynx and supraglottic larynx within normal limits. Glottis normal. Subglottic airway patent clear. Salivary glands: Salivary glands including the parotid and submandibular glands are within normal limits. Thyroid: Tiny 2 mm nodule present within the right thyroid lobe (series 3, image 100), of doubtful significance given size and patient age, no follow-up imaging recommended (ref: J Am Coll Radiol. 2015 Feb;12(2): 143-50). Lymph nodes: Enlarged 1.9 cm right level II lymph node (series 3, image 64), presumably reactive. No other enlarged or pathologic adenopathy within the neck. Vascular: Normal intravascular enhancement seen throughout the neck. Limited intracranial: Unremarkable. Visualized orbits: Unremarkable. Mastoids and visualized paranasal sinuses: Paranasal sinuses are clear. Right-to-left nasal septal deviation with associated concha bullosa. Mastoid air cells and middle ear cavities are well pneumatized and free of fluid. Skeleton: No acute osseous finding. No discrete or worrisome osseous lesions. Upper chest: Visualized upper chest demonstrates no acute finding. Partially visualized lungs are clear. Other: None. IMPRESSION: 1. Prominent and hypertrophied palatine tonsils and adenoidal soft tissues, right worse than left, suggesting acute tonsillitis. No discrete tonsillar or peritonsillar abscess. 2. Enlarged 1.9 cm right level II lymph node, presumably  reactive. Electronically Signed   By: Rise Mu M.D.   On: 05/16/2021 00:55    Procedures Procedures   Medications Ordered in ED Medications  ketorolac (TORADOL) injection 60 mg (has no administration in time range)    ED Course  I have reviewed the triage vital signs and the nursing notes.  Pertinent labs & imaging results that were available during my care of the patient were reviewed by me and considered in my medical decision making (see chart for details).    MDM Rules/Calculators/A&P 20 year old presenting with recurrent sore throat.  Endorsed concern for shortness of breath however there are no signs of airway compromise.  I believe patient's symptoms to be secondary to an anxiety attack.  Viral testing negative again.  I discussed the patient with my attending, Dr. Silverio Lay, who suggested that at this point we order a mono panel.  This will not come back today and the patient agrees that she will look for the results in her chart  and follow-up with primary care.  She currently has Keflex and prednisone as treatment that she has not started.  She will begin this.  Treated with another shot of Toradol prior to discharge.  Final Clinical Impression(s) / ED Diagnoses Final diagnoses:  Sore throat    Rx / DC Orders Results and diagnoses were explained to the patient. Return precautions discussed in full. Patient had no additional questions and expressed complete understanding.     Saddie Benders, PA-C 05/17/21 1704    Charlynne Pander, MD 05/17/21 2157

## 2021-05-17 NOTE — Discharge Instructions (Addendum)
You will see the results of the mono testing in your chart, or be contacted if something is abnormal.  Please follow-up with your primary care provider regardless of the results.  If you do not have one, there is an office attached to these papers.  Also, take the medications that were sent to the pharmacy for you.  Attached is a work note.  I have that you feel better.

## 2021-05-18 LAB — EPSTEIN-BARR VIRUS VCA, IGG: EBV VCA IgG: >600 U/mL — ABNORMAL HIGH (ref 0.0–17.9)

## 2021-05-18 LAB — EPSTEIN-BARR VIRUS VCA, IGM: EBV VCA IgM: <36 U/mL (ref 0.0–35.9)

## 2021-05-19 LAB — CULTURE, GROUP A STREP (THRC)

## 2021-05-22 LAB — RSV(RESPIRATORY SYNCYTIAL VIRUS) AB, BLOOD: RSV Ab: 1:32 {titer} — ABNORMAL HIGH

## 2021-06-18 DIAGNOSIS — Z202 Contact with and (suspected) exposure to infections with a predominantly sexual mode of transmission: Secondary | ICD-10-CM | POA: Diagnosis not present

## 2021-06-19 DIAGNOSIS — N939 Abnormal uterine and vaginal bleeding, unspecified: Secondary | ICD-10-CM | POA: Diagnosis not present

## 2021-07-04 DIAGNOSIS — J029 Acute pharyngitis, unspecified: Secondary | ICD-10-CM | POA: Diagnosis not present

## 2021-07-04 DIAGNOSIS — R0981 Nasal congestion: Secondary | ICD-10-CM | POA: Diagnosis not present

## 2021-07-04 DIAGNOSIS — Z20822 Contact with and (suspected) exposure to covid-19: Secondary | ICD-10-CM | POA: Diagnosis not present

## 2021-07-12 DIAGNOSIS — J029 Acute pharyngitis, unspecified: Secondary | ICD-10-CM | POA: Diagnosis not present

## 2021-07-16 DIAGNOSIS — N6322 Unspecified lump in the left breast, upper inner quadrant: Secondary | ICD-10-CM | POA: Diagnosis not present

## 2021-07-26 ENCOUNTER — Ambulatory Visit
Admission: EM | Admit: 2021-07-26 | Discharge: 2021-07-26 | Disposition: A | Discharge status: Home / Self Care | Payer: Federal, State, Local not specified - PPO | Attending: Physician Assistant | Admitting: Physician Assistant

## 2021-07-26 ENCOUNTER — Other Ambulatory Visit: Payer: Self-pay

## 2021-07-26 DIAGNOSIS — S80852A Superficial foreign body, left lower leg, initial encounter: Secondary | ICD-10-CM | POA: Diagnosis not present

## 2021-07-26 DIAGNOSIS — S8992XA Unspecified injury of left lower leg, initial encounter: Secondary | ICD-10-CM

## 2021-07-26 DIAGNOSIS — S80852S Superficial foreign body, left lower leg, sequela: Secondary | ICD-10-CM | POA: Diagnosis not present

## 2021-07-26 DIAGNOSIS — Z23 Encounter for immunization: Secondary | ICD-10-CM | POA: Diagnosis not present

## 2021-07-26 DIAGNOSIS — W458XXA Other foreign body or object entering through skin, initial encounter: Secondary | ICD-10-CM | POA: Diagnosis not present

## 2021-07-26 MED ORDER — TETANUS-DIPHTH-ACELL PERTUSSIS 5-2.5-18.5 LF-MCG/0.5 IM SUSY
0.5000 mL | PREFILLED_SYRINGE | Freq: Once | INTRAMUSCULAR | Status: AC
  Administered 2021-07-26: 0.5 mL via INTRAMUSCULAR

## 2021-07-26 NOTE — ED Triage Notes (Signed)
Pt here for fishing hook into left lateral calf ?

## 2021-07-26 NOTE — ED Provider Notes (Signed)
?[facility] ? ? ? ?CSN: [id number] ?Arrival date & time: 07/26/21  1816 ? ? ?  ? ?History   ?Chief Complaint ?Chief Complaint  ?Patient presents with  ? hook in leg  ? ? ?HPI ?Wanda Delgado is a 21 y.o. female.  ? ?Patient here today for evaluation of injury to her left lower leg that occurred earlier this afternoon.  She reports that she was in her brother's room and accidentally made contact with a fishing lure he had on a fishing pole and one of the fishing hooks became embedded into her lower leg.  She reports some pain.  She is not up-to-date with her tetanus vaccine. ? ?The history is provided by the patient.  ? ?History reviewed. No pertinent past medical history. ? ?There are no problems to display for this patient. ? ? ?History reviewed. No pertinent surgical history. ? ?OB History   ?No obstetric history on file. ?  ? ? ? ?Home Medications   ? ?Prior to Admission medications   ?Medication Sig Start Date End Date Taking? Authorizing Provider  ?cephALEXin (KEFLEX) 500 MG capsule Take 1 capsule (500 mg total) by mouth 4 (four) times daily. 05/16/21   Etta Quill, NP  ?etonogestrel (NEXPLANON) 68 MG IMPL implant Nexplanon 68 mg subdermal implant ? [facility]    [provider]  ?ondansetron (ZOFRAN-ODT) 4 MG disintegrating tablet Take 1 tablet (4 mg total) by mouth every 8 (eight) hours as needed for nausea or vomiting. 05/06/21   Teodora Medici, FNP  ?predniSONE (DELTASONE) 20 MG tablet 3 tabs po day one, then 2 tabs daily x 4 days 05/16/21   Etta Quill, NP  ? ? ?Family History ?History reviewed. No pertinent family history. ? ?Social History ?Social History  ? ?Tobacco Use  ? Smoking status: Never  ? Smokeless tobacco: Never  ?Vaping Use  ? Vaping Use: Never used  ?Substance Use Topics  ? Alcohol use: Yes  ?  Comment: occ  ? Drug use: Never  ? ? ? ?Allergies   ?Patient has no known allergies. ? ? ?Review of Systems ?Review of Systems  ?Constitutional:   Negative for chills and fever.  ?Eyes:  Negative for discharge and redness.  ?Respiratory:  Negative for shortness of breath.   ?Gastrointestinal:  Negative for abdominal pain, nausea and vomiting.  ?Skin:  Positive for wound. Negative for color change.  ? ? ?Physical Exam ?Triage Vital Signs ?ED Triage Vitals [07/26/21 1828]  ?Enc Vitals Group  ?   BP (!) 151/85  ?   Pulse Rate 96  ?   Resp 20  ?   Temp 98.6 ?F (37 ?C)  ?   Temp Source Oral  ?   SpO2 99 %  ?   Weight   ?   Height   ?   Head Circumference   ?   Peak Flow   ?   Pain Score 0  ?   Pain Loc   ?   Pain Edu?   ?   Excl. in [place]?   ? ?No data found. ? ?Updated Vital Signs ?BP (!) 151/85 (BP Location: Left Arm)   Pulse 96   Temp 98.6 ?F (37 ?C) (Oral)   Resp 20   SpO2 99%  ?   ? ?Physical Exam ?Vitals and nursing note reviewed.  ?Constitutional:   ?   General: She is not in acute distress. ?   Appearance: Normal appearance. She  is not ill-appearing.  ?HENT:  ?   Head: Normocephalic and atraumatic.  ?Eyes:  ?   Conjunctiva/sclera: Conjunctivae normal.  ?Cardiovascular:  ?   Rate and Rhythm: Normal rate.  ?Pulmonary:  ?   Effort: Pulmonary effort is normal.  ?Skin: ?   Comments: One of 3 treble hooks superficially embedded in left lateral lower leg with minimal bleeding  ?Neurological:  ?   Mental Status: She is alert.  ?Psychiatric:     ?   Mood and Affect: Mood normal.     ?   Behavior: Behavior normal.     ?   Thought Content: Thought content normal.  ? ? ? ?UC Treatments / Results  ?Labs ?(all labs ordered are listed, but only abnormal results are displayed) ?Labs Reviewed - No data to display ? ?EKG ? ? ?Radiology ?No results found. ? ?Procedures ?Foreign Body Removal ? ?Date/Time: 07/26/2021 7:25 PM ?Performed by: Francene Finders, PA-C ?Authorized by: Francene Finders, PA-C  ? ?Consent:  ?  Consent obtained:  Verbal ?  Consent given by:  Patient ?  Risks, benefits, and alternatives were discussed: yes   ?  Risks discussed:  Bleeding, infection and  pain ?Universal protocol:  ?  Procedure explained and questions answered to patient or proxy's satisfaction: yes   ?  Relevant documents present and verified: yes   ?  Required blood products, implants, devices, and special equipment available: yes   ?  Patient identity confirmed:  Provided demographic data ?Location:  ?  Location:  Leg ?  Leg location:  L lower leg ?  Depth:  Intradermal ?  Tendon involvement:  None ?Pre-procedure details:  ?  Imaging:  None ?  Neurovascular status: intact   ?Anesthesia:  ?  Anesthesia method:  Local infiltration ?  Local anesthetic:  Lidocaine 1% w/o epi (1 cc) ?Procedure type:  ?  Procedure complexity:  Simple ?Procedure details:  ?  Removal mechanism:  Forceps ?  Foreign bodies recovered:  1 ?  Description:  Fishing hook ?  Intact foreign body removal: yes   ?Post-procedure details:  ?  Confirmation:  No additional foreign bodies on visualization ?  Skin closure:  None ?  Dressing:  Antibiotic ointment and non-adherent dressing ?  Procedure completion:  Tolerated well, no immediate complications (including critical care time) ? ?Medications Ordered in UC ?Medications  ?Tdap (BOOSTRIX) injection 0.5 mL (0.5 mLs Intramuscular Given 07/26/21 1840)  ? ? ?Initial Impression / Assessment and Plan / UC Course  ?I have reviewed the triage vital signs and the nursing notes. ? ?Pertinent labs & imaging results that were available during my care of the patient were reviewed by me and considered in my medical decision making (see chart for details). ? ?  ?Fish hook removed in office without complication. Wound irrigated in office as removal. Recommended she keep wound clean and dressed with antibiotic ointment. Advised follow up with any signs of infection.  ? ?Final Clinical Impressions(s) / UC Diagnoses  ? ?Final diagnoses:  ?Fish hook injury of left lower leg, initial encounter  ? ?Discharge Instructions   ?None ?  ? ?ED Prescriptions   ?None ?  ? ?PDMP not reviewed this encounter. ?   ?Francene Finders, PA-C ?07/26/21 1928 ? ?

## 2021-07-30 DIAGNOSIS — Z13220 Encounter for screening for lipoid disorders: Secondary | ICD-10-CM | POA: Diagnosis not present

## 2021-07-30 DIAGNOSIS — Z0000 Encounter for general adult medical examination without abnormal findings: Secondary | ICD-10-CM | POA: Diagnosis not present

## 2021-10-17 ENCOUNTER — Ambulatory Visit
Admission: EM | Admit: 2021-10-17 | Discharge: 2021-10-17 | Disposition: A | Discharge status: Home / Self Care | Payer: Federal, State, Local not specified - PPO | Attending: Physician Assistant | Admitting: Physician Assistant

## 2021-10-17 DIAGNOSIS — H6123 Impacted cerumen, bilateral: Secondary | ICD-10-CM | POA: Diagnosis not present

## 2021-10-17 DIAGNOSIS — R051 Acute cough: Secondary | ICD-10-CM | POA: Diagnosis not present

## 2021-10-17 DIAGNOSIS — R0981 Nasal congestion: Secondary | ICD-10-CM

## 2021-10-17 DIAGNOSIS — J069 Acute upper respiratory infection, unspecified: Secondary | ICD-10-CM | POA: Diagnosis not present

## 2021-10-17 MED ORDER — PROMETHAZINE-DM 6.25-15 MG/5ML PO SYRP: 5.0000 mL | ORAL_SOLUTION | Freq: Four times a day (QID) | ORAL | 0 refills | Status: DC | PRN

## 2021-10-17 NOTE — ED Triage Notes (Signed)
Patient presents to Urgent Care with complaints of recent covid exposure but was feeling bad prior to exposure. Pt reports sinus pain, sore throat, ha, chills and congestion since 4 days ago. Patient reports no at home covid test or otc medications.

## 2021-10-17 NOTE — Discharge Instructions (Addendum)
We will contact you if your COVID test is positive.  I believe that you have a virus.  Please make sure you are drinking plenty of fluids.  Alternate Tylenol ibuprofen for pain and fever and use Mucinex/Flonase for congestion.  I have prescribed Promethazine DM for cough.  This will make you sleepy so do not drive or drink alcohol with taking it.  If your symptoms not improving within a week you need to return for reevaluation.  If at any point anything worsens and you have high fever not responding to medication, chest pain, shortness of breath, worsening cough, weakness you need to be seen immediately.

## 2021-10-17 NOTE — ED Provider Notes (Addendum)
[facility]    CSN: [id number] Arrival date & time: 10/17/21  1045      History   Chief Complaint Chief Complaint  Patient presents with   URI    HPI Wanda Delgado is a 21 y.o. female.   Patient presents today with a 3-day history of URI symptoms including nasal congestion, cough, scratchy throat, fatigue, malaise.  Denies any fever, chest pain, shortness of breath, nausea, vomiting, diarrhea.  Reports that her boyfriend was recently diagnosed with COVID-19 and she has been in close contact with him.  She has not taken over-the-counter COVID test.  She has not been taking any medication.  Denies any recent antibiotic or steroid use.  She has had COVID in the past with last episode December 2022.  She has had COVID-19 vaccine.  She denies any significant past medical history including allergies, asthma, COPD, smoking.  She is confident that she is not pregnant.  She is having difficulty with daily duties as a result of symptoms.   History reviewed. No pertinent past medical history.  There are no problems to display for this patient.   History reviewed. No pertinent surgical history.  OB History   No obstetric history on file.      Home Medications    Prior to Admission medications   Medication Sig Start Date End Date Taking? Authorizing Provider  promethazine-dextromethorphan (PROMETHAZINE-DM) 6.25-15 MG/5ML syrup Take 5 mLs by mouth 4 (four) times daily as needed for cough. 10/17/21  Yes Connelly [place], Noberto Retort, PA-C  etonogestrel (NEXPLANON) 68 MG IMPL implant Nexplanon 68 mg subdermal implant  1 device provided by [facility].    [provider]  ondansetron (ZOFRAN-ODT) 4 MG disintegrating tablet Take 1 tablet (4 mg total) by mouth every 8 (eight) hours as needed for nausea or vomiting. 05/06/21   Gustavus Bryant, FNP    Family History History reviewed. No pertinent family history.  Social History Social History   Tobacco Use   Smoking status:  Never   Smokeless tobacco: Never  Vaping Use   Vaping Use: Never used  Substance Use Topics   Alcohol use: Yes    Comment: occ   Drug use: Never     Allergies   Patient has no known allergies.   Review of Systems Review of Systems  Constitutional:  Positive for activity change and fatigue. Negative for appetite change and fever.  HENT:  Positive for congestion and sore throat. Negative for sinus pressure and sneezing.   Respiratory:  Positive for cough. Negative for shortness of breath.   Cardiovascular:  Negative for chest pain.  Gastrointestinal:  Negative for abdominal pain, diarrhea, nausea and vomiting.  Neurological:  Positive for headaches. Negative for dizziness and light-headedness.    Physical Exam Triage Vital Signs ED Triage Vitals  Enc Vitals Group     BP 10/17/21 1140 128/87     Pulse Rate 10/17/21 1140 64     Resp 10/17/21 1140 18     Temp 10/17/21 1140 (!) 97.4 F (36.3 C)     Temp Source 10/17/21 1140 Oral     SpO2 10/17/21 1140 96 %     Weight --      Height --      Head Circumference --      Peak Flow --      Pain Score 10/17/21 1139 6     Pain Loc --      Pain Edu? --  Excl. in [place]? --    No data found.  Updated Vital Signs BP 128/87 (BP Location: Right Arm)   Pulse 64   Temp (!) 97.4 F (36.3 C) (Oral)   Resp 18   SpO2 96%   Visual Acuity Right Eye Distance:   Left Eye Distance:   Bilateral Distance:    Right Eye Near:   Left Eye Near:    Bilateral Near:     Physical Exam Vitals reviewed.  Constitutional:      General: She is awake. She is not in acute distress.    Appearance: Normal appearance. She is well-developed. She is not ill-appearing.     Comments: Very pleasant female appears in today to no acute distress sitting comfortably in exam room  HENT:     Head: Normocephalic and atraumatic.     Right Ear: Ear canal and external ear normal. There is impacted cerumen. Tympanic membrane is not erythematous or bulging.      Left Ear: Ear canal and external ear normal. There is impacted cerumen. Tympanic membrane is not erythematous or bulging.     Ears:     Comments: Cerumen impaction noted bilaterally; able to visualize approximately 20% of TM that appears normal.    Nose:     Right Sinus: No maxillary sinus tenderness or frontal sinus tenderness.     Left Sinus: No maxillary sinus tenderness or frontal sinus tenderness.     Mouth/Throat:     Pharynx: Uvula midline. Posterior oropharyngeal erythema present. No oropharyngeal exudate.  Cardiovascular:     Rate and Rhythm: Normal rate and regular rhythm.     Heart sounds: Normal heart sounds, S1 normal and S2 normal. No murmur heard. Pulmonary:     Effort: Pulmonary effort is normal.     Breath sounds: Normal breath sounds. No wheezing, rhonchi or rales.     Comments: Clear to auscultation bilaterally Psychiatric:        Behavior: Behavior is cooperative.     UC Treatments / Results  Labs (all labs ordered are listed, but only abnormal results are displayed) Labs Reviewed  NOVEL CORONAVIRUS, NAA    EKG   Radiology No results found.  Procedures Procedures (including critical care time)  Medications Ordered in UC Medications - No data to display  Initial Impression / Assessment and Plan / UC Course  I have reviewed the triage vital signs and the nursing notes.  Pertinent labs & imaging results that were available during my care of the patient were reviewed by me and considered in my medical decision making (see chart for details).     Patient is well-appearing, afebrile, nontoxic, nontachycardic.  Discussed likely viral etiology.  No evidence of acute infection on physical exam that would warrant initiation of antibiotics.  COVID testing was obtained per patient request.  Flu testing was deferred as she has been symptomatic for more than 3 days and this would not change management.  Recommend conservative treatment measures including Tylenol  and ibuprofen for pain and fever as well as gargling with warm salt water for sore throat.  She was prescribed promethazine DM for cough with instruction not to drive or drink alcohol while taking this medication as drowsiness is a common side effect.  Recommend she use Flonase and Mucinex for additional symptom relief.  She is to rest and drink plenty of fluid.  She was provided a work excuse note.  Discussed that if she has any worsening symptoms she needs to return for  reevaluation.  Addendum: At the end of visit patient reported that the cerumen impaction was more bothersome and she was having difficulty hearing.  Ears were flushed during office visit with improvement of symptoms and removal of cerumen.  Final Clinical Impressions(s) / UC Diagnoses   Final diagnoses:  Upper respiratory tract infection, unspecified type  Acute cough  Nasal congestion  Bilateral impacted cerumen     Discharge Instructions      We will contact you if your COVID test is positive.  I believe that you have a virus.  Please make sure you are drinking plenty of fluids.  Alternate Tylenol ibuprofen for pain and fever and use Mucinex/Flonase for congestion.  I have prescribed Promethazine DM for cough.  This will make you sleepy so do not drive or drink alcohol with taking it.  If your symptoms not improving within a week you need to return for reevaluation.  If at any point anything worsens and you have high fever not responding to medication, chest pain, shortness of breath, worsening cough, weakness you need to be seen immediately.     ED Prescriptions     Medication Sig Dispense Auth. Provider   promethazine-dextromethorphan (PROMETHAZINE-DM) 6.25-15 MG/5ML syrup Take 5 mLs by mouth 4 (four) times daily as needed for cough. 118 mL Tennis Mckinnon K, PA-C      PDMP not reviewed this encounter.   Jeani Hawking, PA-C 10/17/21 1203    Audryna Wendt, Noberto Retort, PA-C 10/17/21 1216

## 2021-10-18 LAB — NOVEL CORONAVIRUS, NAA: SARS-CoV-2, NAA: NOT DETECTED

## 2021-11-10 ENCOUNTER — Emergency Department (HOSPITAL_COMMUNITY): Payer: Federal, State, Local not specified - PPO

## 2021-11-10 ENCOUNTER — Other Ambulatory Visit: Payer: Self-pay

## 2021-11-10 ENCOUNTER — Encounter (HOSPITAL_COMMUNITY): Payer: Self-pay

## 2021-11-10 ENCOUNTER — Emergency Department (HOSPITAL_COMMUNITY)
Admission: EM | Admit: 2021-11-10 | Discharge: 2021-11-10 | Disposition: A | Discharge status: Home / Self Care | Payer: Federal, State, Local not specified - PPO | Attending: Emergency Medicine | Admitting: Emergency Medicine

## 2021-11-10 DIAGNOSIS — Y92149 Unspecified place in prison as the place of occurrence of the external cause: Secondary | ICD-10-CM | POA: Diagnosis not present

## 2021-11-10 DIAGNOSIS — M79631 Pain in right forearm: Secondary | ICD-10-CM | POA: Diagnosis not present

## 2021-11-10 DIAGNOSIS — M79632 Pain in left forearm: Secondary | ICD-10-CM | POA: Insufficient documentation

## 2021-11-10 DIAGNOSIS — S199XXA Unspecified injury of neck, initial encounter: Secondary | ICD-10-CM | POA: Diagnosis not present

## 2021-11-10 DIAGNOSIS — S0081XA Abrasion of other part of head, initial encounter: Secondary | ICD-10-CM | POA: Diagnosis not present

## 2021-11-10 DIAGNOSIS — M542 Cervicalgia: Secondary | ICD-10-CM | POA: Insufficient documentation

## 2021-11-10 DIAGNOSIS — R0781 Pleurodynia: Secondary | ICD-10-CM | POA: Diagnosis not present

## 2021-11-10 DIAGNOSIS — S0990XA Unspecified injury of head, initial encounter: Secondary | ICD-10-CM | POA: Diagnosis not present

## 2021-11-10 MED ORDER — ACETAMINOPHEN 325 MG PO TABS
650.0000 mg | ORAL_TABLET | Freq: Once | ORAL | Status: AC
  Administered 2021-11-10: 650 mg via ORAL
  Filled 2021-11-10: qty 2

## 2021-11-10 NOTE — Discharge Instructions (Signed)
Keep the wound on your forehead clean.  Use Tylenol for pain.  Return here if needed.
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
## 2021-11-10 NOTE — ED Notes (Signed)
Patient given water for fluid challenge.  
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
## 2021-11-21 ENCOUNTER — Ambulatory Visit
Admission: EM | Admit: 2021-11-21 | Discharge: 2021-11-21 | Disposition: A | Discharge status: Home / Self Care | Payer: Federal, State, Local not specified - PPO | Attending: Emergency Medicine | Admitting: Emergency Medicine

## 2021-11-21 DIAGNOSIS — J039 Acute tonsillitis, unspecified: Secondary | ICD-10-CM | POA: Diagnosis not present

## 2021-11-21 LAB — POCT RAPID STREP A (OFFICE): Rapid Strep A Screen: NEGATIVE

## 2021-11-21 LAB — POCT MONO SCREEN (KUC): Mono, POC: NEGATIVE

## 2021-11-21 MED ORDER — ALUM & MAG HYDROXIDE-SIMETH 200-200-20 MG/5ML PO SUSP
30.0000 mL | Freq: Once | ORAL | Status: AC
  Administered 2021-11-21: 30 mL via ORAL

## 2021-11-21 MED ORDER — PENICILLIN V POTASSIUM 500 MG PO TABS: 500.0000 mg | ORAL_TABLET | Freq: Two times a day (BID) | ORAL | 0 refills | Status: AC

## 2021-11-21 MED ORDER — DIPHENHYDRAMINE HCL 12.5 MG/5ML PO ELIX
12.5000 mg | ORAL_SOLUTION | Freq: Once | ORAL | Status: AC
  Administered 2021-11-21: 12.5 mg via ORAL

## 2021-11-21 MED ORDER — LIDOCAINE VISCOUS HCL 2 % MT SOLN: 15.0000 mL | Freq: Once | OROMUCOSAL | Status: DC

## 2021-11-21 MED ORDER — IBUPROFEN 600 MG PO TABS: 600.0000 mg | ORAL_TABLET | Freq: Four times a day (QID) | ORAL | 0 refills | Status: DC | PRN

## 2021-11-21 NOTE — Discharge Instructions (Addendum)
your rapid strep and mono were negative today, so we have sent off a throat culture.  We will contact you if your culture comes back positive for an infection requiring antibiotic treatment.  If your culture is negative, then discontinue the antibiotics.  If you do not have MyChart, then sign up for it or call Katie at [phone] in 2 days to get your culture results.  Give [place] a working phone number.  1 gram of Tylenol and 600 mg ibuprofen together 3-4 times a day as needed for pain.  Make sure you drink plenty of extra fluids.  Some people find salt water gargles and  Tr[employer] Medic[employer]'s "Throat Coat" tea helpful. Take 5 mL of liquid Benadryl and 5 mL of Maalox. Mix it together, and then hold it in your mouth for as long as you can and then swallow. You may do this 4 times a day.  Finish the antibiotics even if you feel better, unless the culture is negative.  Below is a list of primary care practices who are taking new patients for you to follow-up with.  [facility] -multiple locations.  See website at [email or web address]  [facility] internal medicine clinic Ground Floor - [facility], [address] [place], [place], [place] [address] [phone]  [facility] Primary Care at [facility] [address] [place], [place] [address] [phone]  [facility] [address] [place], [place] [address] [phone]  [facility] [phone] [address] [place] [address]  [facility]: [address] [place] [address]  [phone]  [facility]: [address] [place] [address]  [phone]  [facility] : [address] [place] [address] [phone]  [facility] Primary Care: [address] [place] [address] [phone]  [facility]: [address] [place] [address] [phone]  Dr. Oneal Grout [address] [facility] [place] [address]  [phone]  Go to www.goodrx.com  or www.costplusdrugs.com to look up your medications. This will give you a list of where you can find your prescriptions at the most affordable prices. Or ask the pharmacist what the cash price is, or if they have any other discount programs available to help make your medication more affordable. This can be less expensive than what you would pay with insurance.

## 2021-11-21 NOTE — ED Provider Notes (Signed)
HPI  SUBJECTIVE:  Patient reports sore throat starting 4 to 5 days ago. Sx worse with talking swallowing.  Sx better with ibuprofen 400 mg.  No fever + Swollen neck glands   No neck stiffness  No Cough + nasal congestion, no rhinorrhea No Myalgias No Headache No Rash  No loss of taste or smell No shortness of breath or difficulty breathing No nausea, vomiting No diarrhea No abdominal pain     No Recent Strep, mono, COVID exposure Did not get the COVID-vaccine  No Breathing difficulty, voice changes, sensation of throat swelling shut No Drooling No Trismus No abx in past month.  No antipyretic in past 4-6 hrs  Past medical history of mono in December 22.  States this feels similar to that. LMP: Amenorrheic due to Nexplanon.  Denies possibility being pregnant PCP: None   History reviewed. No pertinent past medical history.  History reviewed. No pertinent surgical history.  Family History  Family history unknown: Yes    Social History   Tobacco Use   Smoking status: Never   Smokeless tobacco: Never  Vaping Use   Vaping Use: Never used  Substance Use Topics   Alcohol use: Yes    Comment: occ   Drug use: Never    No current facility-administered medications for this encounter.  Current Outpatient Medications:    ibuprofen (ADVIL) 600 MG tablet, Take 1 tablet (600 mg total) by mouth every 6 (six) hours as needed., Disp: 30 tablet, Rfl: 0   penicillin v potassium (VEETID) 500 MG tablet, Take 1 tablet (500 mg total) by mouth 2 (two) times daily for 10 days. X 10 days, Disp: 20 tablet, Rfl: 0   etonogestrel (NEXPLANON) 68 MG IMPL implant, Nexplanon 68 mg subdermal implant  1 [facility] Disp: , Rfl:   No Known Allergies   ROS  As noted in HPI.   Physical Exam  BP 126/81 (BP Location: Left Arm)   Pulse (!) 53   Temp 98.3 F (36.8 C) (Oral)   Resp 17   SpO2 98%   Constitutional: Well developed, well nourished, no acute  distress Eyes:  EOMI, conjunctiva normal bilaterally HENT: Normocephalic, atraumatic,mucus membranes moist.  Positive erythematous oropharynx.  Tonsils normal size, with exudates.  Positive petechiae on palate.  Uvula midline.  Respiratory: Normal inspiratory effort Cardiovascular: Normal rate, no murmurs, rubs, gallops GI: nondistended, nontender. No appreciable splenomegaly skin: No rash, skin intact Lymph: Positive tender anterior cervical adenopathy.  No posterior cervical lymphadenopathy Musculoskeletal: no deformities Neurologic: Alert & oriented x 3, no focal neuro deficits Psychiatric: Speech and behavior appropriate  ED Course   Medications  alum & mag hydroxide-simeth (MAALOX/MYLANTA) 200-200-20 MG/5ML suspension 30 mL (30 mLs Oral Given 11/21/21 1727)  diphenhydrAMINE (BENADRYL) 12.5 MG/5ML elixir 12.5 mg (12.5 mg Oral Given 11/21/21 1727)    Orders Placed This Encounter  Procedures   Culture, group A strep    Standing Status:   Standing    Number of Occurrences:   1   POCT mono screen    Standing Status:   Standing    Number of Occurrences:   1   POCT rapid strep A    Standing Status:   Standing    Number of Occurrences:   1    Results for orders placed or performed during the hospital encounter of 11/21/21 (from the past 24 hour(s))  POCT mono screen     Status: None   Collection Time: 11/21/21  5:39 PM  Result Value Ref Range   Mono, POC Negative Negative  POCT rapid strep A     Status: None   Collection Time: 11/21/21  5:39 PM  Result Value Ref Range   Rapid Strep A Screen Negative Negative   No results found.  ED Clinical Impression  1. Exudative tonsillitis      ED Assessment/Plan  Patient with an exudative pharyngitis/tonsillitis.  Checking strep, mono.  Benadryl/Maalox mixture  Rapid strep, mono negative.  Patient feels better after Benadryl/Maalox mixture.  Discussed the options of starting antibiotics today and discontinuing if the strep  culture is negative versus waiting on culture results to start them.  She would like to start antibiotics today.  Warned her that if she gets a rash with the antibiotics, then this is most likely mono.  Patient home with ibuprofen, Tylenol, Benadryl/Maalox mixture.  Work note for today.  Patient to followup with PCP when necessary, will refer to local primary care resources.  Discussed labs,  MDM, plan and followup with patient. Discussed sn/sx that should prompt return to the ED. patient agrees with plan.   Meds ordered this encounter  Medications   alum & mag hydroxide-simeth (MAALOX/MYLANTA) 200-200-20 MG/5ML suspension 30 mL   DISCONTD: lidocaine (XYLOCAINE) 2 % viscous mouth solution 15 mL   diphenhydrAMINE (BENADRYL) 12.5 MG/5ML elixir 12.5 mg   ibuprofen (ADVIL) 600 MG tablet    Sig: Take 1 tablet (600 mg total) by mouth every 6 (six) hours as needed.    Dispense:  30 tablet    Refill:  0   penicillin v potassium (VEETID) 500 MG tablet    Sig: Take 1 tablet (500 mg total) by mouth 2 (two) times daily for 10 days. X 10 days    Dispense:  20 tablet    Refill:  0     *This clinic note was created using Scientist, clinical (histocompatibility and immunogenetics). Therefore, there may be occasional mistakes despite careful proofreading.     Domenick Gong, MD 11/21/21 [phone]

## 2021-11-21 NOTE — ED Triage Notes (Signed)
Pt presents with sore throat X 4 days.  Pt has Hx of  mono

## 2021-11-24 LAB — CULTURE, GROUP A STREP (THRC)

## 2022-01-30 ENCOUNTER — Encounter: Payer: Self-pay | Admitting: Emergency Medicine

## 2022-01-30 ENCOUNTER — Ambulatory Visit
Admission: EM | Admit: 2022-01-30 | Discharge: 2022-01-30 | Disposition: A | Discharge status: Home / Self Care | Payer: Federal, State, Local not specified - PPO | Attending: Physician Assistant | Admitting: Physician Assistant

## 2022-01-30 DIAGNOSIS — R21 Rash and other nonspecific skin eruption: Secondary | ICD-10-CM

## 2022-01-30 MED ORDER — PREDNISONE 20 MG PO TABS: 40.0000 mg | ORAL_TABLET | Freq: Every day | ORAL | 0 refills | Status: AC

## 2022-01-30 NOTE — ED Provider Notes (Signed)
[facility]    CSN: [id number] Arrival date & time: 01/30/22  1348      History   Chief Complaint Chief Complaint  Patient presents with   Rash    HPI Wanda Delgado is a 21 y.o. female.   Patient here today for evaluation of rash on her chest, arms and abdomen that started yesterday.  She reports that symptoms started after she used a soap from a hotel room and is not sure if this is what caused symptoms.  She has not eaten any new foods.  She denies any shortness of breath or trouble swallowing.  She has not had any facial swelling.  The history is provided by the patient.  Rash Associated symptoms: no fever and not vomiting     History reviewed. No pertinent past medical history.  There are no problems to display for this patient.   History reviewed. No pertinent surgical history.  OB History   No obstetric history on file.      Home Medications    Prior to Admission medications   Medication Sig Start Date End Date Taking? Authorizing Provider  predniSONE (DELTASONE) 20 MG tablet Take 2 tablets (40 mg total) by mouth daily with breakfast for 5 days. 01/30/22 02/04/22 Yes Tomi Bamberger, PA-C  etonogestrel (NEXPLANON) 68 MG IMPL implant Nexplanon 68 mg subdermal implant  1 device provided by [facility].    [provider]  ibuprofen (ADVIL) 600 MG tablet Take 1 tablet (600 mg total) by mouth every 6 (six) hours as needed. 11/21/21   Domenick Gong, MD    Family History Family History  Family history unknown: Yes    Social History Social History   Tobacco Use   Smoking status: Never   Smokeless tobacco: Never  Vaping Use   Vaping Use: Never used  Substance Use Topics   Alcohol use: Yes    Comment: occ   Drug use: Never     Allergies   Patient has no known allergies.   Review of Systems Review of Systems  Constitutional:  Negative for chills and fever.  Eyes:  Negative for discharge and redness.  Gastrointestinal:   Negative for vomiting.  Skin:  Positive for rash.     Physical Exam Triage Vital Signs ED Triage Vitals  Enc Vitals Group     BP 01/30/22 1415 120/78     Pulse Rate 01/30/22 1415 (!) 49     Resp 01/30/22 1415 16     Temp 01/30/22 1415 98 F (36.7 C)     Temp src --      SpO2 01/30/22 1415 95 %     Weight --      Height --      Head Circumference --      Peak Flow --      Pain Score 01/30/22 1416 0     Pain Loc --      Pain Edu? --      Excl. in [place]? --    No data found.  Updated Vital Signs BP 120/78   Pulse (!) 49   Temp 98 F (36.7 C)   Resp 16   SpO2 95%   Physical Exam Vitals and nursing note reviewed.  Constitutional:      General: She is not in acute distress.    Appearance: Normal appearance. She is not ill-appearing.  HENT:     Head: Normocephalic and atraumatic.  Eyes:     Conjunctiva/sclera: Conjunctivae  normal.  Cardiovascular:     Rate and Rhythm: Normal rate.  Pulmonary:     Effort: Pulmonary effort is normal.  Skin:    Comments: Scattered erythematous pinpoint papules to chest and abdomen and right forearm  Neurological:     Mental Status: She is alert.  Psychiatric:        Mood and Affect: Mood normal.        Behavior: Behavior normal.        Thought Content: Thought content normal.      UC Treatments / Results  Labs (all labs ordered are listed, but only abnormal results are displayed) Labs Reviewed - No data to display  EKG   Radiology No results found.  Procedures Procedures (including critical care time)  Medications Ordered in UC Medications - No data to display  Initial Impression / Assessment and Plan / UC Course  I have reviewed the triage vital signs and the nursing notes.  Pertinent labs & imaging results that were available during my care of the patient were reviewed by me and considered in my medical decision making (see chart for details).   Steroid burst prescribed to hopefully improve dermatitis.  I suspect  that rash is likely reaction to different soap use recently.  Encouraged follow-up if no gradual improvement of symptoms or with any further concerns.  Final Clinical Impressions(s) / UC Diagnoses   Final diagnoses:  Rash and nonspecific skin eruption   Discharge Instructions   None    ED Prescriptions     Medication Sig Dispense Auth. Provider   predniSONE (DELTASONE) 20 MG tablet Take 2 tablets (40 mg total) by mouth daily with breakfast for 5 days. 10 tablet Tomi Bamberger, PA-C      PDMP not reviewed this encounter.   Tomi Bamberger, PA-C 01/30/22 1439

## 2022-01-30 NOTE — ED Triage Notes (Signed)
Pt is present today with a rash on her abdomen, arms and chest. Pt sx started yesterday

## 2022-03-02 ENCOUNTER — Ambulatory Visit (INDEPENDENT_AMBULATORY_CARE_PROVIDER_SITE_OTHER): Payer: Federal, State, Local not specified - PPO

## 2022-03-02 ENCOUNTER — Ambulatory Visit
Admission: RE | Admit: 2022-03-02 | Discharge: 2022-03-02 | Disposition: A | Discharge status: Home / Self Care | Payer: Federal, State, Local not specified - PPO | Source: Ambulatory Visit | Attending: Internal Medicine | Admitting: Internal Medicine

## 2022-03-02 VITALS — BP 117/77 | HR 82 | Temp 98.0°F | Resp 16

## 2022-03-02 DIAGNOSIS — R059 Cough, unspecified: Secondary | ICD-10-CM | POA: Diagnosis not present

## 2022-03-02 DIAGNOSIS — R053 Chronic cough: Secondary | ICD-10-CM

## 2022-03-02 MED ORDER — PREDNISONE 20 MG PO TABS: 40.0000 mg | ORAL_TABLET | Freq: Every day | ORAL | 0 refills | Status: AC

## 2022-03-02 MED ORDER — BENZONATATE 100 MG PO CAPS: 100.0000 mg | ORAL_CAPSULE | Freq: Three times a day (TID) | ORAL | 0 refills | Status: AC | PRN

## 2022-03-02 NOTE — ED Provider Notes (Signed)
[facility]    CSN: [id number] Arrival date & time: 03/02/22  0943      History   Chief Complaint Chief Complaint  Patient presents with   Cough    Cough x2 weeks - Entered by patient    HPI Wanda Delgado is a 21 y.o. female.   Patient presents with persistent cough that has been present for about 3 weeks.  Patient reports that symptoms started with upper respiratory symptoms and nasal congestion but that has now resolved.  She is left with a productive cough.  Denies any associated fever.  Her grandmother has had similar symptoms.  Patient has taken some cough medication that she is not sure the name of with minimal improvement in symptoms.  Denies history of asthma.  Denies chest pain or shortness of breath as well.   Cough   History reviewed. No pertinent past medical history.  There are no problems to display for this patient.   History reviewed. No pertinent surgical history.  OB History   No obstetric history on file.      Home Medications    Prior to Admission medications   Medication Sig Start Date End Date Taking? Authorizing Provider  benzonatate (TESSALON) 100 MG capsule Take 1 capsule (100 mg total) by mouth every 8 (eight) hours as needed for cough. 03/02/22  Yes Ramon Zanders, Hildred Alamin E, FNP  predniSONE (DELTASONE) 20 MG tablet Take 2 tablets (40 mg total) by mouth daily for 5 days. 03/02/22 03/07/22 Yes Darvin Dials, Michele Rockers, FNP  etonogestrel (NEXPLANON) 68 MG IMPL implant Nexplanon 68 mg subdermal implant  1 device provided by [facility] [facility].    [provider]  ibuprofen (ADVIL) 600 MG tablet Take 1 tablet (600 mg total) by mouth every 6 (six) hours as needed. 11/21/21   Melynda Ripple, MD    Family History Family History  Family history unknown: Yes    Social History Social History   Tobacco Use   Smoking status: Never   Smokeless tobacco: Never  Vaping Use   Vaping Use: Never used  Substance Use Topics   Alcohol use: Yes     Comment: occ   Drug use: Never     Allergies   Patient has no known allergies.   Review of Systems Review of Systems Per HPI  Physical Exam Triage Vital Signs ED Triage Vitals  Enc Vitals Group     BP 03/02/22 1026 117/77     Pulse Rate 03/02/22 1025 82     Resp 03/02/22 1025 16     Temp 03/02/22 1025 98 F (36.7 C)     Temp Source 03/02/22 1025 Oral     SpO2 03/02/22 1025 99 %     Weight --      Height --      Head Circumference --      Peak Flow --      Pain Score 03/02/22 1025 0     Pain Loc --      Pain Edu? --      Excl. in [place]? --    No data found.  Updated Vital Signs BP 117/77   Pulse 82   Temp 98 F (36.7 C) (Oral)   Resp 16   SpO2 99%   Visual Acuity Right Eye Distance:   Left Eye Distance:   Bilateral Distance:    Right Eye Near:   Left Eye Near:    Bilateral Near:     Physical Exam Constitutional:  General: She is not in acute distress.    Appearance: Normal appearance. She is not toxic-appearing or diaphoretic.  HENT:     Head: Normocephalic and atraumatic.     Right Ear: Tympanic membrane and ear canal normal.     Left Ear: Tympanic membrane and ear canal normal.     Nose: No congestion.     Mouth/Throat:     Mouth: Mucous membranes are moist.     Pharynx: No posterior oropharyngeal erythema.  Eyes:     Extraocular Movements: Extraocular movements intact.     Conjunctiva/sclera: Conjunctivae normal.     Pupils: Pupils are equal, round, and reactive to light.  Cardiovascular:     Rate and Rhythm: Normal rate and regular rhythm.     Pulses: Normal pulses.     Heart sounds: Normal heart sounds.  Pulmonary:     Effort: Pulmonary effort is normal. No respiratory distress.     Breath sounds: Normal breath sounds. No stridor. No wheezing, rhonchi or rales.  Abdominal:     General: Abdomen is flat. Bowel sounds are normal.     Palpations: Abdomen is soft.  Musculoskeletal:        General: Normal range of motion.     Cervical  back: Normal range of motion.  Skin:    General: Skin is warm and dry.  Neurological:     General: No focal deficit present.     Mental Status: She is alert and oriented to person, place, and time. Mental status is at baseline.  Psychiatric:        Mood and Affect: Mood normal.        Behavior: Behavior normal.      UC Treatments / Results  Labs (all labs ordered are listed, but only abnormal results are displayed) Labs Reviewed - No data to display  EKG   Radiology DG Chest 2 View  Result Date: 03/02/2022 CLINICAL DATA:  Persistent cough EXAM: CHEST - 2 VIEW COMPARISON:  Chest x-ray January 04, 2009 FINDINGS: The heart size and mediastinal contours are within normal limits. No focal pulmonary opacity. No pleural effusion or pneumothorax. Visualized upper abdomen is unremarkable. No acute osseous abnormality. IMPRESSION: No acute cardiopulmonary abnormality. Electronically Signed   By: Jacob Moores M.D.   On: 03/02/2022 10:54    Procedures Procedures (including critical care time)  Medications Ordered in UC Medications - No data to display  Initial Impression / Assessment and Plan / UC Course  I have reviewed the triage vital signs and the nursing notes.  Pertinent labs & imaging results that were available during my care of the patient were reviewed by me and considered in my medical decision making (see chart for details).     Chest x-ray was negative for any acute cardiopulmonary process.  Suspect possible viral bronchitis.  Do not think antibiotic therapy is necessary given negative chest x-ray and no associated persistent upper respiratory symptoms or concern for secondary bacterial infection.  Will treat with prednisone to decrease inflammation in chest and a cough medication.  Patient has taken prednisone approximately 1 month ago and tolerated well so I do think this is reasonable.  Patient advised to follow-up if symptoms persist or worsen.  Patient verbalized  understanding and was agreeable with plan. Final Clinical Impressions(s) / UC Diagnoses   Final diagnoses:  Persistent cough for 3 weeks or longer     Discharge Instructions      Chest x-ray was normal.  I have prescribed  you prednisone and a cough medication to help alleviate symptoms.  Follow-up if symptoms persist or worsen.     ED Prescriptions     Medication Sig Dispense Auth. Provider   predniSONE (DELTASONE) 20 MG tablet Take 2 tablets (40 mg total) by mouth daily for 5 days. 10 tablet [place], Aneg[place] E, [place]   benzonatate (TESSALON) 100 MG capsule Take 1 capsule (100 mg total) by mouth every 8 (eight) hours as needed for cough. 21 capsule [place], Acie Fredrickson, [place]      PDMP not reviewed this encounter.   Gustavus Bryant, [place] 03/02/22 1121

## 2022-03-02 NOTE — ED Triage Notes (Signed)
Pt c/o continuous cough after being sick ~ 3 weeks ago. Denies pain in triage.

## 2022-03-02 NOTE — Discharge Instructions (Signed)
Chest x-ray was normal.  I have prescribed you prednisone and a cough medication to help alleviate symptoms.  Follow-up if symptoms persist or worsen.

## 2022-03-27 ENCOUNTER — Ambulatory Visit
Admission: EM | Admit: 2022-03-27 | Discharge: 2022-03-27 | Disposition: A | Discharge status: Home / Self Care | Payer: Federal, State, Local not specified - PPO | Attending: Physician Assistant | Admitting: Physician Assistant

## 2022-03-27 ENCOUNTER — Ambulatory Visit (INDEPENDENT_AMBULATORY_CARE_PROVIDER_SITE_OTHER): Payer: Federal, State, Local not specified - PPO

## 2022-03-27 DIAGNOSIS — R059 Cough, unspecified: Secondary | ICD-10-CM | POA: Diagnosis not present

## 2022-03-27 DIAGNOSIS — J069 Acute upper respiratory infection, unspecified: Secondary | ICD-10-CM | POA: Insufficient documentation

## 2022-03-27 DIAGNOSIS — Z1152 Encounter for screening for COVID-19: Secondary | ICD-10-CM | POA: Diagnosis not present

## 2022-03-27 LAB — RESP PANEL BY RT-PCR (FLU A&B, COVID) ARPGX2
Influenza A by PCR: NEGATIVE
Influenza B by PCR: NEGATIVE
SARS Coronavirus 2 by RT PCR: NEGATIVE

## 2022-03-27 NOTE — ED Triage Notes (Signed)
Pt presents with ongoing productive cough and sore throat that has been unrelieved with prescribed medication.

## 2022-03-27 NOTE — ED Provider Notes (Signed)
[facility]    CSN: [id number] Arrival date & time: 03/27/22  1040      History   Chief Complaint Chief Complaint  Patient presents with   URI    HPI Wanda Delgado is a 21 y.o. female.   She here today for evaluation of ongoing productive cough and sore throat.  She reports that medications prescribed have not relieved symptoms.  Upon further discussion patient reports that she actually did have mild improvement and thinks she might of come into contact with another virus.  She denies any fever.  She has not had any vomiting or diarrhea.    The history is provided by the patient.  URI Presenting symptoms: congestion, cough and sore throat   Presenting symptoms: no ear pain and no fever   Associated symptoms: no wheezing     History reviewed. No pertinent past medical history.  There are no problems to display for this patient.   History reviewed. No pertinent surgical history.  OB History   No obstetric history on file.      Home Medications    Prior to Admission medications   Medication Sig Start Date End Date Taking? Authorizing Provider  benzonatate (TESSALON) 100 MG capsule Take 1 capsule (100 mg total) by mouth every 8 (eight) hours as needed for cough. 03/02/22   Gustavus Bryant, FNP  etonogestrel (NEXPLANON) 68 MG IMPL implant Nexplanon 68 mg subdermal implant  1 device provided by [facility].    [provider]  ibuprofen (ADVIL) 600 MG tablet Take 1 tablet (600 mg total) by mouth every 6 (six) hours as needed. 11/21/21   Domenick Gong, MD    Family History Family History  Family history unknown: Yes    Social History Social History   Tobacco Use   Smoking status: Never   Smokeless tobacco: Never  Vaping Use   Vaping Use: Never used  Substance Use Topics   Alcohol use: Yes    Comment: occ   Drug use: Never     Allergies   Patient has no known allergies.   Review of Systems Review of Systems   Constitutional:  Negative for chills and fever.  HENT:  Positive for congestion and sore throat. Negative for ear pain.   Eyes:  Negative for discharge and redness.  Respiratory:  Positive for cough. Negative for shortness of breath and wheezing.   Gastrointestinal:  Negative for abdominal pain, diarrhea, nausea and vomiting.     Physical Exam Triage Vital Signs ED Triage Vitals  Enc Vitals Group     BP 03/27/22 1212 124/79     Pulse Rate 03/27/22 1212 68     Resp 03/27/22 1212 18     Temp 03/27/22 1212 98.2 F (36.8 C)     Temp Source 03/27/22 1212 Oral     SpO2 03/27/22 1212 97 %     Weight --      Height --      Head Circumference --      Peak Flow --      Pain Score 03/27/22 1214 5     Pain Loc --      Pain Edu? --      Excl. in [place]? --    No data found.  Updated Vital Signs BP 124/79 (BP Location: Left Arm)   Pulse 68   Temp 98.2 F (36.8 C) (Oral)   Resp 18   SpO2 97%     Physical Exam Vitals  and nursing note reviewed.  Constitutional:      General: She is not in acute distress.    Appearance: Normal appearance. She is not ill-appearing.  HENT:     Head: Normocephalic and atraumatic.     Nose: Congestion present.     Mouth/Throat:     Mouth: Mucous membranes are moist.     Pharynx: No oropharyngeal exudate or posterior oropharyngeal erythema.  Eyes:     Conjunctiva/sclera: Conjunctivae normal.  Cardiovascular:     Rate and Rhythm: Normal rate and regular rhythm.     Heart sounds: Normal heart sounds. No murmur heard. Pulmonary:     Effort: Pulmonary effort is normal. No respiratory distress.     Breath sounds: Normal breath sounds. No wheezing, rhonchi or rales.  Skin:    General: Skin is warm and dry.  Neurological:     Mental Status: She is alert.  Psychiatric:        Mood and Affect: Mood normal.        Thought Content: Thought content normal.      UC Treatments / Results  Labs (all labs ordered are listed, but only abnormal results are  displayed) Labs Reviewed  RESP PANEL BY RT-PCR (FLU A&B, COVID) [id number]    EKG   Radiology DG Chest 2 View  Result Date: 03/27/2022 CLINICAL DATA:  21 year old female presenting for cough evaluation. EXAM: CHEST - 2 VIEW COMPARISON:  March 02, 2022. FINDINGS: The heart size and mediastinal contours are within normal limits. Both lungs are clear. The visualized skeletal structures are unremarkable. IMPRESSION: No active cardiopulmonary disease. Electronically Signed   By: Zetta Bills M.D.   On: 03/27/2022 12:44    Procedures Procedures (including critical care time)  Medications Ordered in UC Medications - No data to display  Initial Impression / Assessment and Plan / UC Course  I have reviewed the triage vital signs and the nursing notes.  Pertinent labs & imaging results that were available during my care of the patient were reviewed by me and considered in my medical decision making (see chart for details).    Chest x-ray ordered given recurrent and persistent cough.  Chest x-ray negative for abnormality.  Suspect likely other viral illness.  Screening ordered for same.  Encouraged symptomatic treatment, increase fluids and rest.  We will await results for further recommendation but encouraged follow-up with any further concerns.  Final Clinical Impressions(s) / UC Diagnoses   Final diagnoses:  Acute upper respiratory infection  Encounter for screening for COVID-19   Discharge Instructions   None    ED Prescriptions   None    PDMP not reviewed this encounter.   Francene Finders, PA-C 03/27/22 1521

## 2022-07-23 DIAGNOSIS — Z113 Encounter for screening for infections with a predominantly sexual mode of transmission: Secondary | ICD-10-CM | POA: Diagnosis not present

## 2022-07-28 DIAGNOSIS — Z124 Encounter for screening for malignant neoplasm of cervix: Secondary | ICD-10-CM | POA: Diagnosis not present

## 2022-07-28 DIAGNOSIS — Z01419 Encounter for gynecological examination (general) (routine) without abnormal findings: Secondary | ICD-10-CM | POA: Diagnosis not present

## 2022-07-28 DIAGNOSIS — Z113 Encounter for screening for infections with a predominantly sexual mode of transmission: Secondary | ICD-10-CM | POA: Diagnosis not present

## 2022-07-28 DIAGNOSIS — R87610 Atypical squamous cells of undetermined significance on cytologic smear of cervix (ASC-US): Secondary | ICD-10-CM | POA: Diagnosis not present

## 2022-07-28 DIAGNOSIS — Z130 Encounter for screening for diseases of the blood and blood-forming organs and certain disorders involving the immune mechanism: Secondary | ICD-10-CM | POA: Diagnosis not present

## 2022-07-28 DIAGNOSIS — Z1151 Encounter for screening for human papillomavirus (HPV): Secondary | ICD-10-CM | POA: Diagnosis not present

## 2022-08-25 ENCOUNTER — Ambulatory Visit
Admission: EM | Admit: 2022-08-25 | Discharge: 2022-08-25 | Disposition: A | Discharge status: Home / Self Care | Payer: Federal, State, Local not specified - PPO | Attending: Physician Assistant | Admitting: Physician Assistant

## 2022-08-25 DIAGNOSIS — R0789 Other chest pain: Secondary | ICD-10-CM

## 2022-08-25 DIAGNOSIS — M948X9 Other specified disorders of cartilage, unspecified sites: Secondary | ICD-10-CM | POA: Diagnosis not present

## 2022-08-25 MED ORDER — IBUPROFEN 600 MG PO TABS
600.0000 mg | ORAL_TABLET | Freq: Four times a day (QID) | ORAL | 0 refills | Status: AC | PRN
Start: 2022-08-25 — End: ?

## 2022-08-25 NOTE — Discharge Instructions (Signed)
Advised to take ibuprofen 600 mg every 6 hours with food on a regular basis to see if this helps relieve the pain and discomfort.  Advised to watch what you are lifting and try to go easy if possible over the next several days.  Advised follow-up PCP return to urgent care as needed.

## 2022-08-25 NOTE — ED Triage Notes (Signed)
Pt is here for left side chest wall/ breast pain. Pt states this pain started 3 days ago. Pt denies any trauma or falls to her chest.

## 2022-08-25 NOTE — ED Provider Notes (Signed)
[facility]    CSN: [id number] Arrival date & time: 08/25/22  1443      History   Chief Complaint Chief Complaint  Patient presents with   Chest Injury    Chest wall pain   Breast Pain    HPI Wanda Delgado is a 22 y.o. female.   22 year old female presents with left anterior chest wall pain.  Patient indicates for the past 3 days she has been having persistent and progressive left anterior chest wall pain that is on the inside of the left breast and under the left breast.  She indicates that she has pain in the area when she breathes deep, and pain when she lifts her left arm above her head.  Patient indicates she is not having shortness of breath, fever, chills.  Patient indicates that she does not have any upper extremity numbness or tingling.  Patient indicates she does not have any injury to the area that she is aware of.  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  She indicates this may contribute to her symptoms.  Patient indicates that she does smoke marijuana on a regular basis but does not smoke cigarettes or vape.  She has not taken any OTC medications for relief.  Patient denies any breast tenderness, nipple drainage.  Patient indicates she does have the Implanon and has only spotting periods intermittently.     History reviewed. No pertinent past medical history.  There are no problems to display for this patient.   History reviewed. No pertinent surgical history.  OB History   No obstetric history on file.      Home Medications    Prior to Admission medications   Medication Sig Start Date End Date Taking? Authorizing Provider  benzonatate (TESSALON) 100 MG capsule Take 1 capsule (100 mg total) by mouth every 8 (eight) hours as needed for cough. 03/02/22   Teodora Medici, FNP  etonogestrel (NEXPLANON) 68 MG IMPL implant Nexplanon 68 mg  subdermal implant  1 device provided by [facility].    [provider]  ibuprofen (ADVIL) 600 MG tablet Take 1 tablet (600 mg total) by mouth every 6 (six) hours as needed. 08/25/22   Nyoka Lint, PA-C    Family History Family History  Family history unknown: Yes    Social History Social History   Tobacco Use   Smoking status: Never   Smokeless tobacco: Never  Vaping Use   Vaping Use: Never used  Substance Use Topics   Alcohol use: Yes    Comment: occ   Drug use: Never     Allergies   Patient has no known allergies.   Review of Systems Review of Systems  Respiratory:  Positive for chest tightness (left anterior chest wall pain).      Physical Exam Triage Vital Signs ED Triage Vitals  Enc Vitals Group     BP 08/25/22 1646 104/65     Pulse Rate 08/25/22 1646 86     Resp 08/25/22 1646 18     Temp 08/25/22 1646 98.5 F (36.9 C)     Temp Source 08/25/22 1646 Oral     SpO2 08/25/22 1646 97 %     Weight --      Height --      Head Circumference --      Peak Flow --  Pain Score 08/25/22 1647 7     Pain Loc --      Pain Edu? --      Excl. in [place]? --    No data found.  Updated Vital Signs BP 104/65 (BP Location: Left Arm)   Pulse 86   Temp 98.5 F (36.9 C) (Oral)   Resp 18   SpO2 97%   Visual Acuity Right Eye Distance:   Left Eye Distance:   Bilateral Distance:    Right Eye Near:   Left Eye Near:    Bilateral Near:     Physical Exam Constitutional:      Appearance: Normal appearance.  Cardiovascular:     Rate and Rhythm: Normal rate and regular rhythm.     Heart sounds: Normal heart sounds.  Pulmonary:     Effort: Pulmonary effort is normal.     Breath sounds: Normal breath sounds and air entry. No wheezing, rhonchi or rales.  Chest:       Comments: Chest wall: Tenderness was palpated along the mid sternum and the left upper breast area without any unusual swelling or redness.  Breast exam: There is no abnormal lumps, bumps, or  masses on examination, no nipple drainage bilaterally, no unusual redness or swelling of the breasts. Abdominal:     General: Abdomen is flat. Bowel sounds are normal.     Palpations: Abdomen is soft.     Tenderness: There is no abdominal tenderness.  Neurological:     Mental Status: She is alert.      UC Treatments / Results  Labs (all labs ordered are listed, but only abnormal results are displayed) Labs Reviewed - No data to display  EKG   Radiology No results found.  Procedures Procedures (including critical care time)  Medications Ordered in UC Medications - No data to display  Initial Impression / Assessment and Plan / UC Course  I have reviewed the triage vital signs and the nursing notes.  Pertinent labs & imaging results that were available during my care of the patient were reviewed by me and considered in my medical decision making (see chart for details).    Plan: The diagnosis will be treated with the following: 1.  Left-sided chest wall pain: A.  Ibuprofen 600 mg every 6 hours on a regular basis to help decrease pain and discomfort. 2.  Chondritis: A.  Ibuprofen 600 mg every 6 hours on a regular basis to help decrease pain and discomfort. 3.  Advised follow-up PCP return to urgent care as needed. Final Clinical Impressions(s) / UC Diagnoses   Final diagnoses:  Left-sided chest wall pain  Chondritis     Discharge Instructions      Advised to take ibuprofen 600 mg every 6 hours with food on a regular basis to see if this helps relieve the pain and discomfort.  Advised to watch what you are lifting and try to go easy if possible over the next several days.  Advised follow-up PCP return to urgent care as needed.    ED Prescriptions     Medication Sig Dispense Auth. Provider   ibuprofen (ADVIL) 600 MG tablet Take 1 tablet (600 mg total) by mouth every 6 (six) hours as needed. 30 tablet Nyoka Lint, PA-C      PDMP not reviewed this  encounter.   Nyoka Lint, PA-C 08/25/22 1711

## 2022-12-30 DIAGNOSIS — J329 Chronic sinusitis, unspecified: Secondary | ICD-10-CM | POA: Diagnosis not present

## 2023-01-13 DIAGNOSIS — L293 Anogenital pruritus, unspecified: Secondary | ICD-10-CM | POA: Diagnosis not present

## 2023-01-13 DIAGNOSIS — Z202 Contact with and (suspected) exposure to infections with a predominantly sexual mode of transmission: Secondary | ICD-10-CM | POA: Diagnosis not present

## 2023-01-13 DIAGNOSIS — Z113 Encounter for screening for infections with a predominantly sexual mode of transmission: Secondary | ICD-10-CM | POA: Diagnosis not present

## 2023-02-16 DIAGNOSIS — Z3046 Encounter for surveillance of implantable subdermal contraceptive: Secondary | ICD-10-CM | POA: Diagnosis not present

## 2023-12-12 ENCOUNTER — Ambulatory Visit: Admission: EM | Admit: 2023-12-12 | Discharge: 2023-12-12 | Disposition: A | Discharge status: Home / Self Care

## 2023-12-12 DIAGNOSIS — H6123 Impacted cerumen, bilateral: Secondary | ICD-10-CM | POA: Diagnosis not present

## 2023-12-12 DIAGNOSIS — U071 COVID-19: Secondary | ICD-10-CM

## 2023-12-12 DIAGNOSIS — Z8619 Personal history of other infectious and parasitic diseases: Secondary | ICD-10-CM | POA: Insufficient documentation

## 2023-12-12 LAB — POC SARS CORONAVIRUS 2 AG -  ED: SARS Coronavirus 2 Ag: POSITIVE — AB

## 2023-12-12 LAB — POCT INFLUENZA A/B
Influenza A, POC: NEGATIVE
Influenza B, POC: NEGATIVE

## 2023-12-12 LAB — POCT RAPID STREP A (OFFICE): Rapid Strep A Screen: NEGATIVE

## 2023-12-12 MED ORDER — PROMETHAZINE-DM 6.25-15 MG/5ML PO SYRP: 5.0000 mL | ORAL_SOLUTION | Freq: Four times a day (QID) | ORAL | 0 refills | Status: AC | PRN

## 2023-12-12 MED ORDER — AZELASTINE HCL 0.1 % NA SOLN
1.0000 | Freq: Two times a day (BID) | NASAL | 1 refills | Status: AC
Start: 2023-12-12 — End: ?

## 2023-12-12 MED ORDER — ACETAMINOPHEN 325 MG PO TABS
650.0000 mg | ORAL_TABLET | Freq: Once | ORAL | Status: AC
  Administered 2023-12-12: 650 mg via ORAL

## 2023-12-12 MED ORDER — IBUPROFEN 600 MG PO TABS
600.0000 mg | ORAL_TABLET | Freq: Four times a day (QID) | ORAL | 0 refills | Status: AC | PRN
Start: 2023-12-12 — End: ?

## 2023-12-12 NOTE — Discharge Instructions (Addendum)
  1. COVID-19 (Primary) - POCT rapid strep A complete in UC is negative for strep pharyngitis - POCT Influenza A/B completed in UC is negative for influenza. - POC SARS Coronavirus 2 Ag-ED - Nasal Swab completed UC is positive for COVID-19 - azelastine  (ASTELIN ) 0.1 % nasal spray; Place 1 spray into both nostrils 2 (two) times daily. Use in each nostril as directed  Dispense: 30 mL; Refill: 1 - promethazine -dextromethorphan (PROMETHAZINE -DM) 6.25-15 MG/5ML syrup; Take 5 mLs by mouth 4 (four) times daily as needed for cough.  Dispense: 240 mL; Refill: 0 - ibuprofen  (ADVIL ) 600 MG tablet; Take 1 tablet (600 mg total) by mouth every 6 (six) hours as needed.  Dispense: 30 tablet; Refill: 0  2. Bilateral impacted cerumen - Ear wax removal completed in UC, no retained cerumen, no secondary infection. -Continue to monitor symptoms for any change in severity if there is any escalation of current symptoms or development of new symptoms follow-up in ER for further evaluation and management.

## 2023-12-12 NOTE — ED Provider Notes (Signed)
 [facility]  Note:  This document was prepared using Conservation officer, historic buildings and may include unintentional dictation errors.  MRN: [id number] DOB: November 18, 2000  Subjective:   Wanda Delgado is a 23 y.o. female presenting for fever, sore throat, cough, nasal congestion, bilateral ear pressure, chills, body aches x 2 to 3 days.  Denies any known sick exposure.  Patient reports that yesterday after work Wanda Delgado had chills and bodyaches but felt extremely hot came home and went to sleep.  Patient would like COVID testing today in urgent care.  Patient is not taking any over-the-counter medication to treat symptoms.  No current facility-administered medications for this encounter.  Current Outpatient Medications:    amoxicillin (AMOXIL) 500 MG capsule, Take 500 mg by mouth every 8 (eight) hours., Disp: , Rfl:    azelastine  (ASTELIN ) 0.1 % nasal spray, Place 1 spray into both nostrils 2 (two) times daily. Use in each nostril as directed, Disp: 30 mL, Rfl: 1   cefTRIAXone (ROCEPHIN) 500 MG injection, Inject 500 mg into the muscle once., Disp: , Rfl:    etonogestrel (NEXPLANON) 68 MG IMPL implant, Nexplanon 68 mg subdermal implant  1 device provided by [facility]., Disp: , Rfl:    HYDROcodone-acetaminophen  (NORCO) 7.5-325 MG tablet, Take 1 tablet by mouth every 6 (six) hours as needed., Disp: , Rfl:    ibuprofen  (ADVIL ) 600 MG tablet, Take 1 tablet (600 mg total) by mouth every 6 (six) hours as needed., Disp: 30 tablet, Rfl: 0   ibuprofen  (ADVIL ) 600 MG tablet, Take 1 tablet (600 mg total) by mouth every 6 (six) hours as needed., Disp: 30 tablet, Rfl: 0   metroNIDAZOLE (FLAGYL) 500 MG tablet, Take 500 mg by mouth 2 (two) times daily., Disp: , Rfl:    promethazine -dextromethorphan (PROMETHAZINE -DM) 6.25-15 MG/5ML syrup, Take 5 mLs by mouth 4 (four) times daily as needed for cough., Disp: 240 mL, Rfl: 0   benzonatate  (TESSALON ) 100 MG capsule, Take 1 capsule (100 mg total) by mouth  every 8 (eight) hours as needed for cough., Disp: 21 capsule, Rfl: 0   fluconazole (DIFLUCAN) 150 MG tablet, Take 150 mg by mouth once., Disp: , Rfl:    valACYclovir (VALTREX) 1000 MG tablet, Take 1 tablet by mouth every 12 (twelve) hours., Disp: , Rfl:    valACYclovir (VALTREX) 500 MG tablet, Take 1 tablet by mouth 2 (two) times daily., Disp: , Rfl:    No Known Allergies  History reviewed. No pertinent past medical history.   History reviewed. No pertinent surgical history.  Family History  Family history unknown: Yes    Social History   Tobacco Use   Smoking status: Never   Smokeless tobacco: Never  Vaping Use   Vaping status: Never Used  Substance Use Topics   Alcohol use: Yes    Comment: occ   Drug use: Never    ROS Refer to HPI for ROS details.  Objective:   Vitals: BP 123/89 (BP Location: Left Arm)   Pulse 90   Temp (!) 101.1 F (38.4 C) (Oral)   Resp 18   Ht 5' 9 (1.753 m)   Wt 160 lb 0.9 oz (72.6 kg)   LMP  (LMP Unknown)   SpO2 96%   BMI 23.64 kg/m   Physical Exam Vitals and nursing note reviewed.  Constitutional:      General: Wanda Delgado is not in acute distress.    Appearance: Normal appearance. Wanda Delgado is well-developed. Wanda Delgado is not ill-appearing, toxic-appearing or diaphoretic.  HENT:     Head: Normocephalic and atraumatic.     Right Ear: Ear canal and external ear normal. There is impacted cerumen.     Left Ear: Ear canal and external ear normal. There is impacted cerumen.     Nose: Congestion present. No rhinorrhea.     Mouth/Throat:     Mouth: Mucous membranes are moist.     Pharynx: Oropharynx is clear. Posterior oropharyngeal erythema present.  Eyes:     General:        Right eye: No discharge.        Left eye: No discharge.     Extraocular Movements: Extraocular movements intact.     Conjunctiva/sclera: Conjunctivae normal.  Cardiovascular:     Rate and Rhythm: Normal rate and regular rhythm.     Heart sounds: Normal heart sounds. No murmur  heard. Pulmonary:     Effort: Pulmonary effort is normal. No respiratory distress.     Breath sounds: Normal breath sounds. No stridor. No wheezing, rhonchi or rales.  Chest:     Chest wall: No tenderness.  Skin:    General: Skin is warm and dry.  Neurological:     General: No focal deficit present.     Mental Status: Wanda Delgado is alert and oriented to person, place, and time.  Psychiatric:        Mood and Affect: Mood normal.        Behavior: Behavior normal.     Procedures  Results for orders placed or performed during the hospital encounter of 12/12/23 (from the past 24 hours)  POCT rapid strep A     Status: Normal   Collection Time: 12/12/23  8:45 AM  Result Value Ref Range   Rapid Strep A Screen Negative Negative  POC SARS Coronavirus 2 Ag-ED - Nasal Swab     Status: Abnormal   Collection Time: 12/12/23  8:50 AM  Result Value Ref Range   SARS Coronavirus 2 Ag Positive (A) Negative  POCT Influenza A/B     Status: Normal   Collection Time: 12/12/23  8:53 AM  Result Value Ref Range   Influenza A, POC Negative Negative   Influenza B, POC Negative Negative    No results found.   Assessment and Plan :     Discharge Instructions       1. COVID-19 (Primary) - POCT rapid strep A complete in UC is negative for strep pharyngitis - POCT Influenza A/B completed in UC is negative for influenza. - POC SARS Coronavirus 2 Ag-ED - Nasal Swab completed UC is positive for COVID-19 - azelastine  (ASTELIN ) 0.1 % nasal spray; Place 1 spray into both nostrils 2 (two) times daily. Use in each nostril as directed  Dispense: 30 mL; Refill: 1 - promethazine -dextromethorphan (PROMETHAZINE -DM) 6.25-15 MG/5ML syrup; Take 5 mLs by mouth 4 (four) times daily as needed for cough.  Dispense: 240 mL; Refill: 0 - ibuprofen  (ADVIL ) 600 MG tablet; Take 1 tablet (600 mg total) by mouth every 6 (six) hours as needed.  Dispense: 30 tablet; Refill: 0  2. Bilateral impacted cerumen - Ear wax removal  completed in UC, no retained cerumen, no secondary infection. -Continue to monitor symptoms for any change in severity if there is any escalation of current symptoms or development of new symptoms follow-up in ER for further evaluation and management.       Aydin Cavalieri B Gabryel Files   Modean Mccullum, [place], [place] 12/12/23 [phone]

## 2023-12-12 NOTE — ED Triage Notes (Signed)
 This started with sore throat about 2 days ago, then cough (productive), nose congestion, ear's stopped up, body aching now (from head to feet), cold chills, migraine now today. No fever known. I would like a COVID19 test.
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
